# Patient Record
Sex: Female | Born: 1935 | Race: White | Hispanic: No | Smoking: Current every day smoker
Health system: Southern US, Community
[De-identification: ages and names within clinical notes are randomized; demographics above are authoritative.]

## PROBLEM LIST (undated history)

## (undated) DIAGNOSIS — B019 Varicella without complication: Secondary | ICD-10-CM

## (undated) DIAGNOSIS — E079 Disorder of thyroid, unspecified: Secondary | ICD-10-CM

## (undated) DIAGNOSIS — F419 Anxiety disorder, unspecified: Secondary | ICD-10-CM

## (undated) DIAGNOSIS — F17210 Nicotine dependence, cigarettes, uncomplicated: Secondary | ICD-10-CM

## (undated) DIAGNOSIS — I10 Essential (primary) hypertension: Secondary | ICD-10-CM

## (undated) DIAGNOSIS — G709 Myoneural disorder, unspecified: Secondary | ICD-10-CM

## (undated) DIAGNOSIS — I739 Peripheral vascular disease, unspecified: Secondary | ICD-10-CM

## (undated) DIAGNOSIS — Z720 Tobacco use: Secondary | ICD-10-CM

## (undated) DIAGNOSIS — J439 Emphysema, unspecified: Secondary | ICD-10-CM

## (undated) DIAGNOSIS — E785 Hyperlipidemia, unspecified: Principal | ICD-10-CM

## (undated) DIAGNOSIS — J449 Chronic obstructive pulmonary disease, unspecified: Secondary | ICD-10-CM

## (undated) DIAGNOSIS — R030 Elevated blood-pressure reading, without diagnosis of hypertension: Secondary | ICD-10-CM

## (undated) DIAGNOSIS — J189 Pneumonia, unspecified organism: Secondary | ICD-10-CM

## (undated) DIAGNOSIS — R634 Abnormal weight loss: Secondary | ICD-10-CM

## (undated) DIAGNOSIS — G610 Guillain-Barre syndrome: Secondary | ICD-10-CM

## (undated) DIAGNOSIS — K219 Gastro-esophageal reflux disease without esophagitis: Secondary | ICD-10-CM

## (undated) HISTORY — DX: Anxiety disorder, unspecified: F41.9

## (undated) HISTORY — DX: Pneumonia, unspecified organism: J18.9

## (undated) HISTORY — DX: Emphysema, unspecified: J43.9

## (undated) HISTORY — DX: Elevated blood-pressure reading, without diagnosis of hypertension: R03.0

## (undated) HISTORY — DX: Gastro-esophageal reflux disease without esophagitis: K21.9

## (undated) HISTORY — DX: Peripheral vascular disease, unspecified: I73.9

## (undated) HISTORY — DX: Hyperlipidemia, unspecified: E78.5

## (undated) HISTORY — DX: Essential (primary) hypertension: I10

## (undated) HISTORY — DX: Abnormal weight loss: R63.4

## (undated) HISTORY — DX: Disorder of thyroid, unspecified: E07.9

## (undated) HISTORY — DX: Guillain-Barre syndrome: G61.0

## (undated) HISTORY — DX: Varicella without complication: B01.9

## (undated) HISTORY — DX: Myoneural disorder, unspecified: G70.9

## (undated) HISTORY — DX: Nicotine dependence, cigarettes, uncomplicated: F17.210

## (undated) HISTORY — DX: Chronic obstructive pulmonary disease, unspecified: J44.9

## (undated) HISTORY — DX: Tobacco use: Z72.0

## (undated) HISTORY — PX: CATARACT EXTRACTION, BILATERAL: SHX1313

---

## 1998-09-08 ENCOUNTER — Other Ambulatory Visit: Admission: RE | Admit: 1998-09-08 | Discharge: 1998-09-08 | Payer: Self-pay | Admitting: Obstetrics and Gynecology

## 1999-08-21 ENCOUNTER — Other Ambulatory Visit: Admission: RE | Admit: 1999-08-21 | Discharge: 1999-08-21 | Payer: Self-pay | Admitting: Obstetrics and Gynecology

## 2000-08-06 ENCOUNTER — Other Ambulatory Visit: Admission: RE | Admit: 2000-08-06 | Discharge: 2000-08-06 | Payer: Self-pay | Admitting: Emergency Medicine

## 2001-07-24 ENCOUNTER — Other Ambulatory Visit: Admission: RE | Admit: 2001-07-24 | Discharge: 2001-07-24 | Payer: Self-pay | Admitting: Emergency Medicine

## 2001-07-30 ENCOUNTER — Encounter: Admission: RE | Admit: 2001-07-30 | Discharge: 2001-07-30 | Payer: Self-pay | Admitting: Emergency Medicine

## 2001-07-30 ENCOUNTER — Encounter: Payer: Self-pay | Admitting: Emergency Medicine

## 2002-03-23 ENCOUNTER — Encounter: Payer: Self-pay | Admitting: Emergency Medicine

## 2002-03-23 ENCOUNTER — Encounter: Admission: RE | Admit: 2002-03-23 | Discharge: 2002-03-23 | Payer: Self-pay | Admitting: Emergency Medicine

## 2004-07-29 ENCOUNTER — Encounter: Admission: RE | Admit: 2004-07-29 | Discharge: 2004-07-29 | Payer: Self-pay | Admitting: Emergency Medicine

## 2005-02-03 ENCOUNTER — Encounter: Admission: RE | Admit: 2005-02-03 | Discharge: 2005-02-03 | Payer: Self-pay | Admitting: Emergency Medicine

## 2005-10-05 ENCOUNTER — Ambulatory Visit (HOSPITAL_COMMUNITY): Admission: RE | Admit: 2005-10-05 | Discharge: 2005-10-05 | Payer: Self-pay | Admitting: Gastroenterology

## 2006-03-12 ENCOUNTER — Ambulatory Visit (HOSPITAL_COMMUNITY): Admission: RE | Admit: 2006-03-12 | Discharge: 2006-03-12 | Payer: Self-pay | Admitting: Ophthalmology

## 2006-08-03 ENCOUNTER — Emergency Department (HOSPITAL_COMMUNITY): Admission: EM | Admit: 2006-08-03 | Discharge: 2006-08-03 | Payer: Self-pay | Admitting: Emergency Medicine

## 2008-06-08 ENCOUNTER — Encounter: Admission: RE | Admit: 2008-06-08 | Discharge: 2008-06-08 | Payer: Self-pay | Admitting: Emergency Medicine

## 2009-06-24 DIAGNOSIS — J4489 Other specified chronic obstructive pulmonary disease: Secondary | ICD-10-CM | POA: Insufficient documentation

## 2009-06-24 DIAGNOSIS — J449 Chronic obstructive pulmonary disease, unspecified: Secondary | ICD-10-CM

## 2009-06-28 ENCOUNTER — Ambulatory Visit: Payer: Self-pay | Admitting: Emergency Medicine

## 2009-06-28 DIAGNOSIS — G610 Guillain-Barre syndrome: Secondary | ICD-10-CM

## 2009-06-28 DIAGNOSIS — J438 Other emphysema: Secondary | ICD-10-CM | POA: Insufficient documentation

## 2009-08-01 ENCOUNTER — Emergency Department (HOSPITAL_COMMUNITY): Admission: EM | Admit: 2009-08-01 | Discharge: 2009-08-01 | Payer: Self-pay | Admitting: Emergency Medicine

## 2009-08-03 ENCOUNTER — Encounter (HOSPITAL_BASED_OUTPATIENT_CLINIC_OR_DEPARTMENT_OTHER): Admission: RE | Admit: 2009-08-03 | Discharge: 2009-09-22 | Payer: Self-pay | Admitting: General Surgery

## 2009-08-22 ENCOUNTER — Ambulatory Visit: Payer: Self-pay | Admitting: Vascular Surgery

## 2009-08-22 ENCOUNTER — Encounter (HOSPITAL_BASED_OUTPATIENT_CLINIC_OR_DEPARTMENT_OTHER): Payer: Self-pay | Admitting: General Surgery

## 2009-08-22 ENCOUNTER — Ambulatory Visit: Admission: RE | Admit: 2009-08-22 | Discharge: 2009-08-22 | Payer: Self-pay | Admitting: General Surgery

## 2010-05-03 LAB — PULMONARY FUNCTION TEST

## 2010-06-09 IMAGING — CR DG FOOT COMPLETE 3+V*R*
3 series · 3 of 3 positions shown · non-contrast
Comparison: None

CLINICAL DATA: Infection on dorsum of foot

RIGHT FOOT COMPLETE - 3+ VIEW

[t foot ap right]
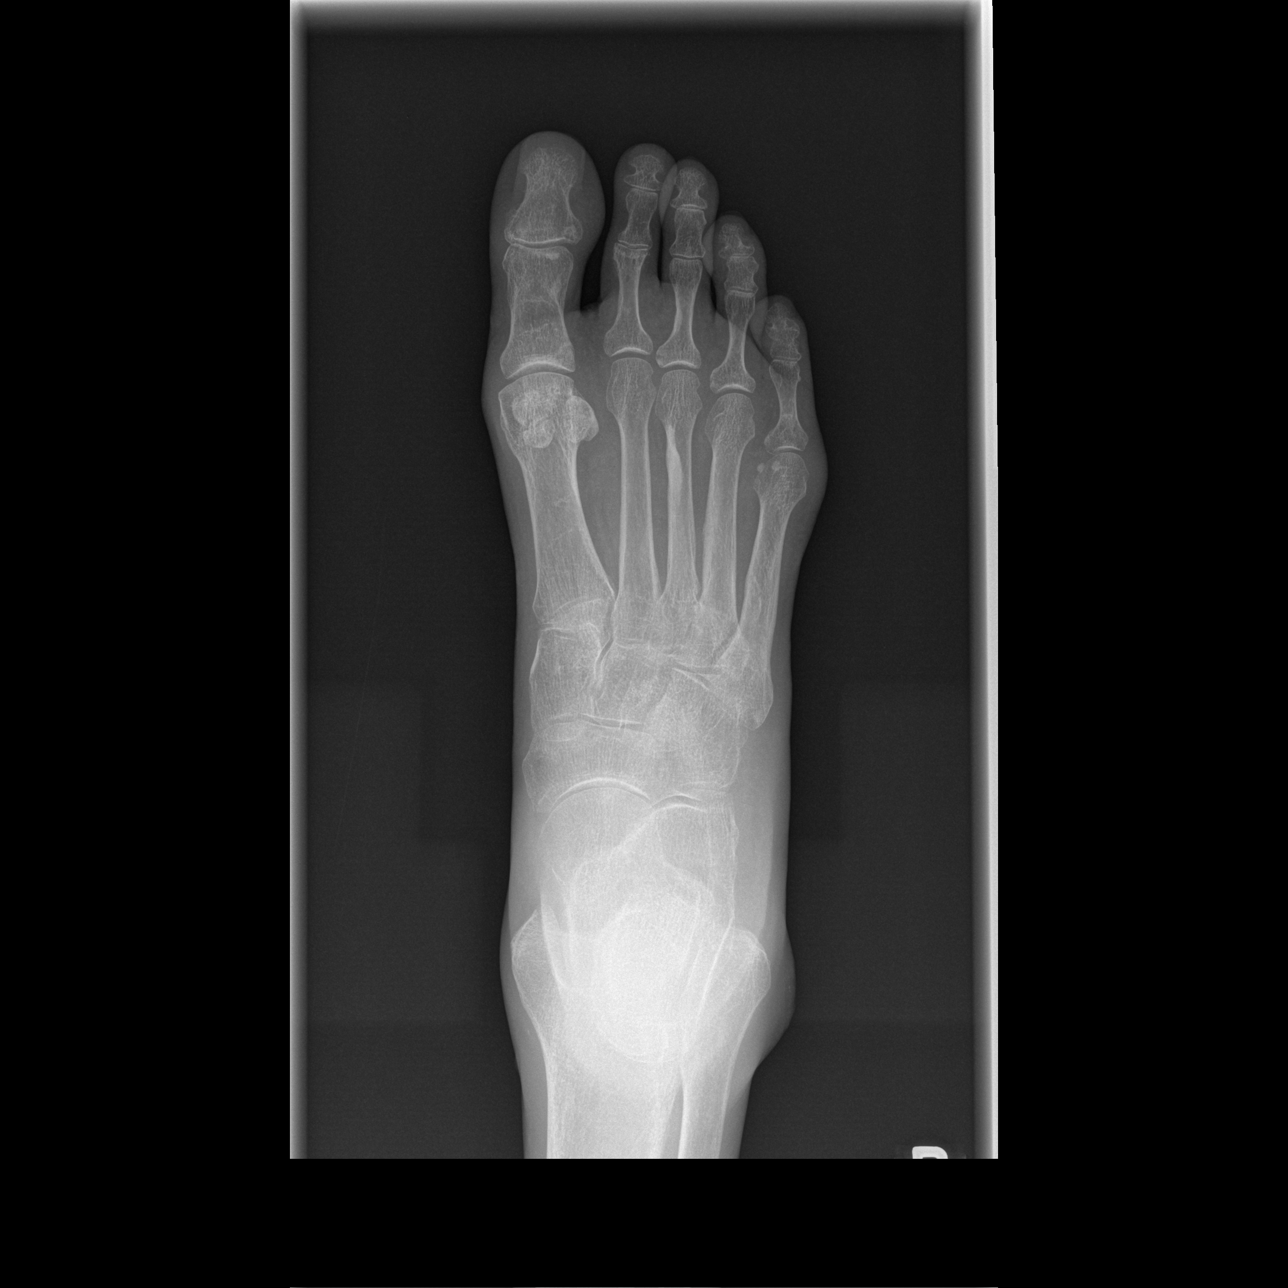

[t foot oblique right]
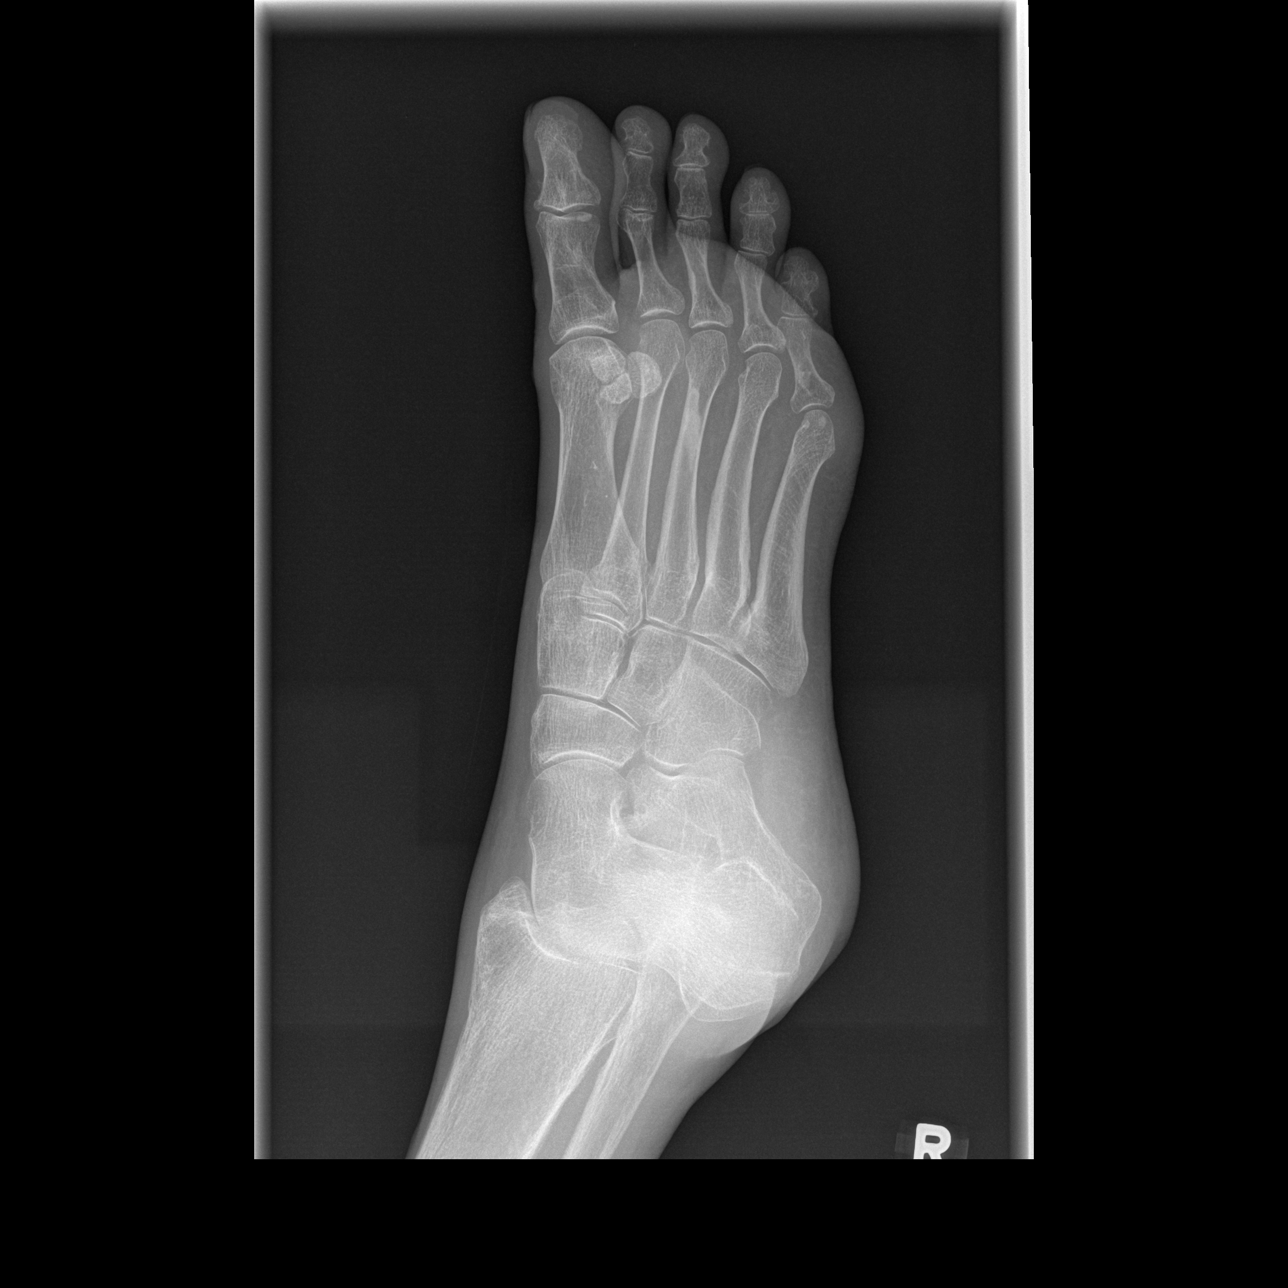

[t foot lat right]
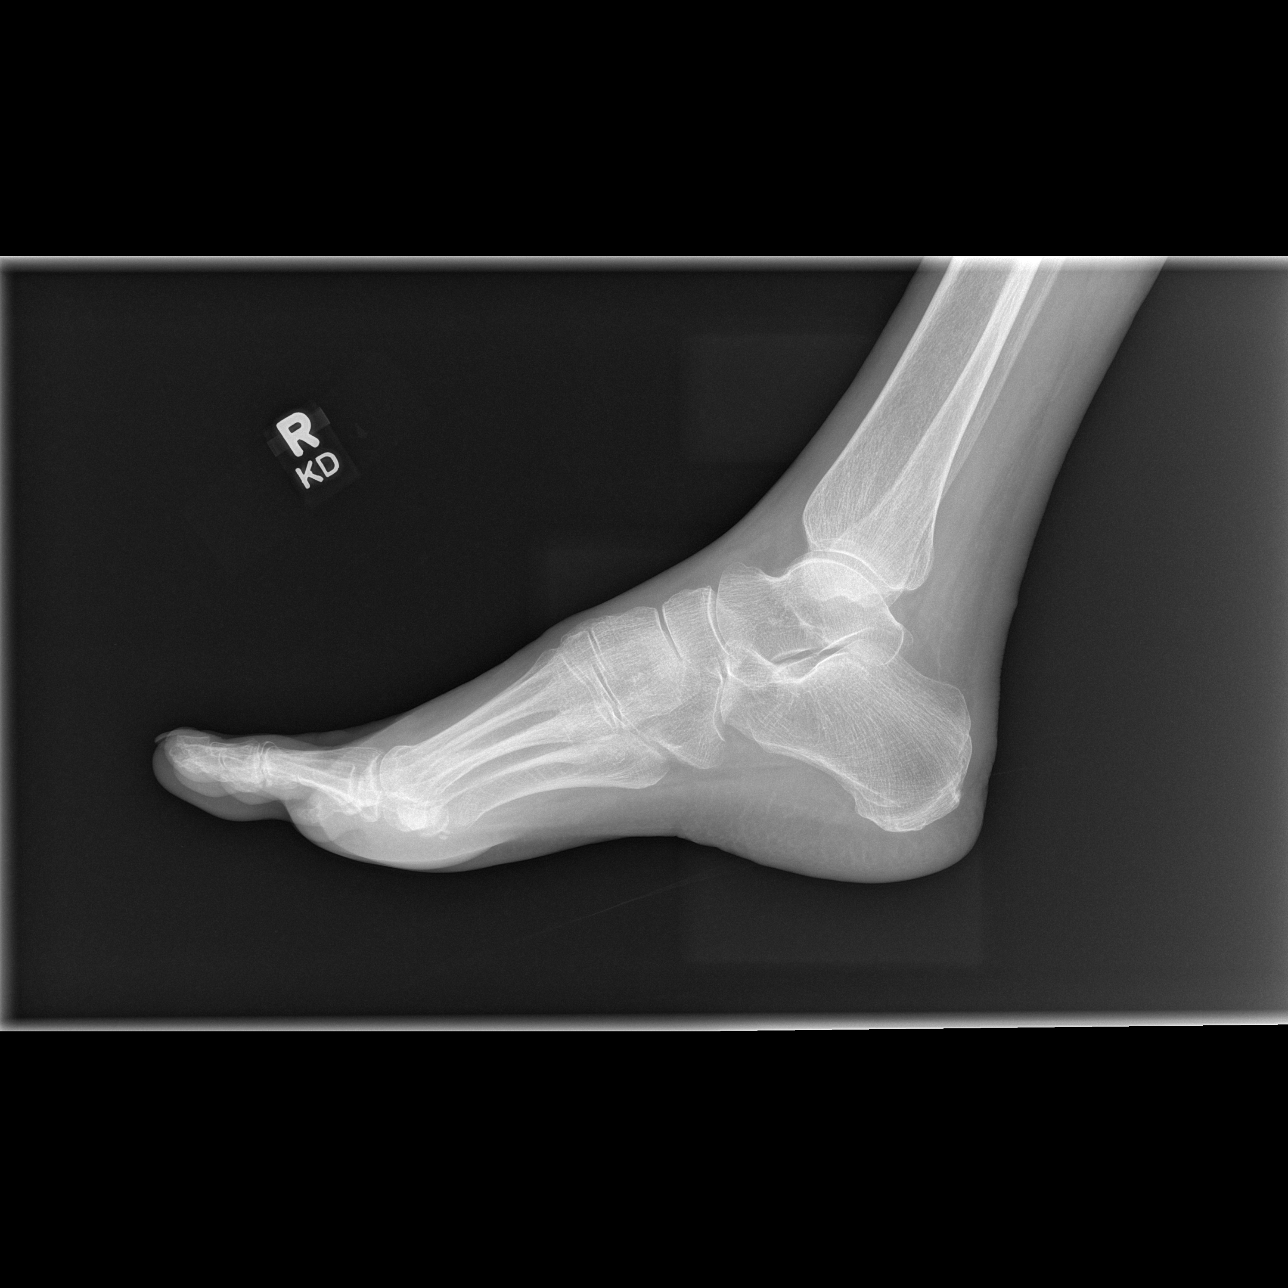

[3 of 3 positions shown; findings below may reference images not displayed]

FINDINGS: The bones are demineralized.  No fractures or lytic
lesions.  There is nonspecific thickening of the cortex of the
distal third metatarsal on the medial side.  This is a benign
finding.  No focal arthropathy.  No foreign body or gas in the soft
tissues.
IMPRESSION: 1.  Osteopenia.
2.  Benign cortical thickening of the third metatarsal.
3.  No acute or specific abnormality.

## 2011-03-31 LAB — HEMOGLOBIN A1C
Hgb A1c MFr Bld: 6.2 % — ABNORMAL HIGH (ref 4.6–6.1)
Mean Plasma Glucose: 131 mg/dL

## 2011-03-31 LAB — PREALBUMIN: Prealbumin: 22.1 mg/dL (ref 18.0–45.0)

## 2011-04-01 LAB — BASIC METABOLIC PANEL
BUN: 9 mg/dL (ref 6–23)
CO2: 30 mEq/L (ref 19–32)
Calcium: 9.3 mg/dL (ref 8.4–10.5)
Chloride: 104 mEq/L (ref 96–112)
Creatinine, Ser: 0.91 mg/dL (ref 0.4–1.2)
GFR calc Af Amer: >60 mL/min (ref >60)
GFR calc non Af Amer: >60 mL/min (ref >60)
Glucose, Bld: 102 mg/dL — ABNORMAL HIGH (ref 70–99)
Potassium: 4.5 mEq/L (ref 3.5–5.1)
Sodium: 140 mEq/L (ref 135–145)

## 2011-04-01 LAB — CBC
HCT: 46.6 % — ABNORMAL HIGH (ref 36.0–46.0)
Hemoglobin: 15.5 g/dL — ABNORMAL HIGH (ref 12.0–15.0)
MCHC: 33.3 g/dL (ref 30.0–36.0)
MCV: 98.3 fL (ref 78.0–100.0)
Platelets: 184 10*3/uL (ref 150–400)
RBC: 4.74 MIL/uL (ref 3.87–5.11)
RDW: 14.3 % (ref 11.5–15.5)
WBC: 8.1 10*3/uL (ref 4.0–10.5)

## 2011-05-08 NOTE — Assessment & Plan Note (Signed)
[facility]   NAME:  Barbara Phelps, Barbara Phelps                ACCOUNT NO.:  [id number]   MEDICAL RECORD NO.:  [id number]      DATE OF BIRTH:  Jan 02, 1932   PHYSICIAN:  Leonie Man, M.D.    VISIT DATE:  08/22/2009                                   OFFICE VISIT   PROBLEM:  Arterial insufficiency ulcers of the right foot in this 77-  year-old female who smokes cigarettes.  The most recent dimensions of  the ulcers are 0.9 x 0.4 x 0.1 and 0.5. x 0.7 x 0.1 which shows some  significant amount of healing since we last saw her.  She underwent  arterial Doppler studies which shows monophasic waveforms from the point  of the common femoral down through the popliteal with severe  atherogeneous plaque to the bifurcation.  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  The feet continued to remain hyperemic and with  dependency there is significant dependent hyperemia.  The wounds are,  however, seeming to close down and today her temperature is 98.5, pulse  78, respirations 19, and blood pressure is 128/74.  We will continue to  treat these wounds with hydrogel, gauze, and Kerlix and that should be  changed every day and we will follow up with her in approximately 1  week.      Leonie Man, M.D.  Electronically Signed     PB/MEDQ  D:  08/22/2009  T:  08/23/2009  Job:  [id number]

## 2011-05-08 NOTE — Assessment & Plan Note (Signed)
[facility]   NAME:  Barbara Phelps, Barbara Phelps                ACCOUNT NO.:  [id number]   MEDICAL RECORD NO.:  [id number]      DATE OF BIRTH:  March 23, 1932   PHYSICIAN:  Leonie Man, M.D.    VISIT DATE:  08/15/2009                                   OFFICE VISIT   PROBLEM:  Arterial insufficiency, ulcers of the right foot in this 77-  year-old patient who continues to smoke despite multiple warnings.  These ulcers have been measured at for the right anterior foot 0.7 x 0.9  x 0.1 and for the right lower extremity anteriorly at 1.0 x 1.5 x 0.1.  The patient has complaints of significant right leg pain which is only  relieved by dependency.   On examination today:  Temperature 98, pulse 74, respirations 16, blood  pressure 151/69.  The patient has significant dependent hyperemia and  the ulcers are shallow.  The ulcer bases are clean and not requiring any  debridement on today's evaluation.  We will go ahead and treat her for  the right foot medially with Prisma hydrogel and for the right foot  laterally with hydrogel only.  The dressings are to be changed every 3  days.  I am going to go ahead and send her for duplex arterial Dopplers  of bilaterally in the legs.  We will also check her hemoglobin A1c and  prealbumin.  I have asked her to come back and see [place] in 1 week.      Leonie Man, M.D.  Electronically Signed     PB/MEDQ  D:  08/15/2009  T:  08/16/2009  Job:  [id number]

## 2011-05-08 NOTE — Assessment & Plan Note (Signed)
[facility]   NAME:  Barbara Phelps, Barbara Phelps NO.:  [id number]   MEDICAL RECORD NO.:  [id number]      DATE OF BIRTH:  1932-02-17   PHYSICIAN:  Ardath Sax, M.D.           VISIT DATE:                                   OFFICE VISIT   She is a 77 year old lady who has had problems with weakness of her  lower extremities since she had a Guillain-Barre disease sometime ago.  She also has glaucoma, emphysema, and hypertension.  She also takes  Synthroid.  She states that her right anterior foot is swollen and sore  and she certainly has some cellulitis on the anterior aspect and has  probably come from a small abrasion when she fell and the little ulcers  are only about a 0.5 cm in diameter and I put her on Keflex 500 mg 3  times a day because the cellulitis was symptomatic and very erythematous  and she had been running a low-grade fever.  I took a culture from one  of the ulcers.  I had her come back in a week.      Ardath Sax, M.D.     PP/MEDQ  D:  08/03/2009  T:  08/03/2009  Job:  [id number]

## 2011-05-11 NOTE — Op Note (Signed)
Barbara Phelps, BOIES                ACCOUNT NO.:  [id number]   MEDICAL RECORD NO.:  [id number]          PATIENT TYPE:  AMB   LOCATION:  ENDO                         FACILITY:  [facility]   PHYSICIAN:  Bernette Redbird, M.D.   DATE OF BIRTH:  [birth year]   DATE OF PROCEDURE:  10/05/2005  DATE OF DISCHARGE:                                 OPERATIVE REPORT   PROCEDURE:  Colonoscopy.   INDICATIONS:  Screening colonoscopy in a 77 year old female with a family  history of colon polyps (indeterminate histology).   FINDINGS:  Extensive sigmoid diverticulosis, otherwise normal to cecum.   DESCRIPTION OF PROCEDURE:  The nature, purpose, and risks of the procedure  had been reviewed with the patient by me prior to the procedure, having also  discussed it with our staff at the office when she was set up as an open  access patient. The patient provided written consent. She was sedated with  fentanyl 75 mcg and Versed 6 mg IV prior to and during the course of the  procedure with careful attention to her oxygen status and she does have a  history of COPD but she never had any problems with desaturation or  respiratory difficulty despite a level mild to moderate sedation.   The Olympus pediatric adjustable tension video colonoscope was advanced with  moderate difficulty through a very angulated sigmoid region, with passage  made easier by having the patient in the supine position. Once past the  sigmoid region, the scope was able be advanced quite easily to the cecum as  identified by visualization of the appendiceal orifice and the absence of  further lumen and pullback was then performed. The quality of the prep was  excellent and it is felt that all areas were well seen.   There was moderately severe sigmoid diverticulosis as mentioned above, but  this was otherwise a normal examination, without evidence of polyps, cancer,  colitis or vascular malformations. Due to a small rectal ampulla, I did not  perform retroflexion in the rectum but antegrade viewing and reinspection of  the rectum was unremarkable. No biopsies were obtained. The patient  tolerated the procedure well and there were no apparent complications.   IMPRESSION:  Family history of colon polyps, without worrisome findings on  current examination (V18.51).   PLAN:  Since the patient has attained age 26 without having formed any  colonic adenomas, and in view of the fact that she has COPD, I would be  inclined not to perform further routine screening exams in this particular  patient. Depending on the histology of her brother's colon polyps, she would  either be due for a repeat exam at age 60 or at  age 8, and in either event, I think that the risk of the procedure would  likely outweigh benefit. The patient might be considered for virtual  colonoscopy for screening purposes if it is approved by insurance reasons 5  years from now, at the discretion of her primary physician and depending on  her overall health status.           ______________________________  Bernette Redbird, M.D.     RB/MEDQ  D:  10/05/2005  T:  10/05/2005  Job:  [id number]   cc:   Reuben Likes, M.D.  Fax: [phone]

## 2011-05-11 NOTE — Op Note (Signed)
NAMECHRISTEN, Barbara Phelps                ACCOUNT NO.:  [id number]   MEDICAL RECORD NO.:  [id number]          PATIENT TYPE:  AMB   LOCATION:  SDS                          FACILITY:  [facility]   PHYSICIAN:  Alford Highland. Rankin, M.D.   DATE OF BIRTH:  [birth year]   DATE OF PROCEDURE:  03/12/2006  DATE OF DISCHARGE:  03/12/2006                                 OPERATIVE REPORT   PREOPERATIVE DIAGNOSIS:  Retained lens fragments, left eye, nonmetallic  foreign body  (mostly small fragments-some perinuclear).   POSTOPERATIVE DIAGNOSES:  1.  Retained lens fragments, left eye, nonmetallic foreign body  (mostly      small fragments-some perinuclear).  2.  Mild uveitis.   OPERATION PERFORMED:  1.  Posterior vitrectomy, left eye, 20 gauge.   SURGEON:  Alford Highland. Rankin, M.D.   ANESTHESIA:  Local retrobulbar with monitored anesthesia control.   INDICATIONS FOR PROCEDURE:  The patient is a 77 year old woman who has  significant vitreous debris, uveitis and large fragments that were not  safely dissolved.  These were in the vitreous cavity after complicated  cataract surgery a week ago.  The patient understands the risks of  anesthesia including the rare occurrence of death but also to the eye  including but not limited to hemorrhage, infection, scarring, need for  another surgery, no change in vision, loss of vision and progression of  disease despite intervention.  After appropriate signed consent was  obtained, the patient was taken to the operating room.   DESCRIPTION OF PROCEDURE:  In the operating room, appropriate monitoring was  followed by mild sedation.  0.75% Marcaine was delivered 5 mL retrobulbar  followed by an additional 5 mL laterally in the fashion of modified Darel Hong.  The right periocular region was sterilely prepped and draped in the  usual ophthalmic fashion.  A lid speculum was applied.  Conjunctival  peritomy was fashioned superiorly and temporally.  The infusion was secured  3.5 mm  posterior to the limbus.  Placement in the vitreous cavity verified  visually.  Superior sclerotomies then fashioned.  Wild microscope was placed  in position with the Biom attachment.  Core vitrectomy was then begun.  Numerous over 40 fragments were found in the vitreous cavity and these were  removed with vitrectomy instrumentation.  Large perinuclear fragment was  also removed in this fashion.  Some small cortical fragments could be found  in the capsular bag and these could be approached from posterior approach  and were aspirated free.  Others appeared to be somewhat sticky and could  not safely be removed without endangering the peripheral capsule.  At this  time the anterior chamber was also flushed free, therapeutic paracentesis  with the paracentesis incision.  At this time instruments were removed from  the eye and superior sclerotomies were then closed with 7-0 Vicryl.  The  infusion removed and similarly closed with 7-0 Vicryl.  Peripheral retina  was inspected and found to be free of  retinal holes or tears.  Conjunctiva closed with 7-0 Vicryl.  Subconjunctival injection of antibiotic applied.  A sterile  patch and Fox  shield were applied.  . The patient was taken to the short stay area in good  and stable condition without complication.      Alford Highland Rankin, M.D.  Electronically Signed     GAR/MEDQ  D:  03/12/2006  T:  03/13/2006  Job:  [id number]   cc:   Allen Norris, M.D.  Fax: [phone]

## 2011-07-04 ENCOUNTER — Ambulatory Visit (INDEPENDENT_AMBULATORY_CARE_PROVIDER_SITE_OTHER): Payer: Medicare Other | Admitting: Family Medicine

## 2011-07-04 ENCOUNTER — Encounter: Payer: Self-pay | Admitting: Family Medicine

## 2011-07-04 VITALS — BP 154/75 | HR 86 | Temp 97.7°F | Ht 68.0 in | Wt 102.8 lb

## 2011-07-04 DIAGNOSIS — F419 Anxiety disorder, unspecified: Secondary | ICD-10-CM | POA: Insufficient documentation

## 2011-07-04 DIAGNOSIS — J439 Emphysema, unspecified: Secondary | ICD-10-CM | POA: Insufficient documentation

## 2011-07-04 DIAGNOSIS — L570 Actinic keratosis: Secondary | ICD-10-CM

## 2011-07-04 DIAGNOSIS — K219 Gastro-esophageal reflux disease without esophagitis: Secondary | ICD-10-CM

## 2011-07-04 DIAGNOSIS — E079 Disorder of thyroid, unspecified: Secondary | ICD-10-CM | POA: Insufficient documentation

## 2011-07-04 DIAGNOSIS — F17200 Nicotine dependence, unspecified, uncomplicated: Secondary | ICD-10-CM

## 2011-07-04 DIAGNOSIS — IMO0001 Reserved for inherently not codable concepts without codable children: Secondary | ICD-10-CM

## 2011-07-04 DIAGNOSIS — G4700 Insomnia, unspecified: Secondary | ICD-10-CM

## 2011-07-04 DIAGNOSIS — G610 Guillain-Barre syndrome: Secondary | ICD-10-CM

## 2011-07-04 DIAGNOSIS — H269 Unspecified cataract: Secondary | ICD-10-CM | POA: Insufficient documentation

## 2011-07-04 DIAGNOSIS — I739 Peripheral vascular disease, unspecified: Secondary | ICD-10-CM | POA: Insufficient documentation

## 2011-07-04 DIAGNOSIS — E119 Type 2 diabetes mellitus without complications: Secondary | ICD-10-CM

## 2011-07-04 DIAGNOSIS — Z720 Tobacco use: Secondary | ICD-10-CM

## 2011-07-04 DIAGNOSIS — J449 Chronic obstructive pulmonary disease, unspecified: Secondary | ICD-10-CM | POA: Insufficient documentation

## 2011-07-04 HISTORY — DX: Reserved for inherently not codable concepts without codable children: IMO0001

## 2011-07-04 MED ORDER — RANITIDINE HCL 300 MG PO TABS: 300.0000 mg | ORAL_TABLET | Freq: Every day | ORAL | Status: DC

## 2011-07-04 MED ORDER — ALPRAZOLAM 0.5 MG PO TABS: 0.5000 mg | ORAL_TABLET | Freq: Every evening | ORAL | Status: DC | PRN

## 2011-07-04 NOTE — Patient Instructions (Signed)
Nicotine Addiction Nicotine can act as both a stimulant (excites/activates) and a sedative (calms/quiets). Immediately after exposure to nicotine, there is a "kick" caused in part by the drug's stimulation of the adrenal glands and resulting discharge of adrenaline (epinephrine). The rush of adrenaline stimulates the body and causes a sudden release of sugar. This means that smokers are always slightly hyperglycemic. Hyperglycemic means that the blood sugar is high, just like in diabetics. Nicotine also decreases the amount of insulin which helps control sugar levels in the body. There is an increase in blood pressure, breathing, and the rate of heart beats.  In addition, nicotine indirectly causes a release of dopamine in the brain that controls pleasure and motivation. A similar reaction is seen with other drugs of abuse, such as cocaine and heroin. This dopamine release is thought to cause the pleasurable sensations when smoking. In some different cases, nicotine can also create a calming effect, depending on sensitivity of the smoker's nervous system and the dose of nicotine taken. WHAT HAPPENS WHEN NICOTINE IS TAKEN FOR LONG PERIODS OF TIME?  Long-term use of nicotine results in addiction. It is difficult to stop.   Repeated use of nicotine creates tolerance. Higher doses of nicotine are needed to get the "kick."  When nicotine use is stopped, withdrawal may last a month or more. Withdrawal may begin within a few hours after the last cigarette. Symptoms peak within the first few days and may lessen within a few weeks. For some people, however, symptoms may last for months or longer. Withdrawal symptoms include:   Irritability.   Craving.   Learning and attention deficits.   Sleep disturbances.   Increased appetite.  Craving for tobacco may last for 6 months or longer. Many behaviors done while using nicotine can also play a part in the severity of withdrawal symptoms. For some people, the  feel, smell, and sight of a cigarette and the ritual of obtaining, handling, lighting, and smoking the cigarette are closely linked with the pleasure of smoking. When stopped, they also miss the related behaviors which make the withdrawal or craving worse. While nicotine gum and patches may lessen the drug aspects of withdrawal, cravings often persist. WHAT ARE THE MEDICAL CONSEQUENCES OF NICOTINE USE?  Nicotine addiction accounts for one-third of all cancers. The top cancer caused by tobacco is lung cancer. Lung cancer is the number one cancer killer of both men and women.   Smoking is also associated with cancers of the:   Mouth.  Pharynx.   Larynx.   Esophagus.  Stomach.   Pancreas.   Cervix.  Kidney.   Ureter.   Bladder.    Smoking also causes lung diseases such as lasting (chronic) bronchitis and emphysema.   It worsens asthma in adults and children.   Smoking increases the risk of heart disease, including:   Stroke.  Heart attack.  Vascular disease.  Aneurysm.   Passive or secondary smoke can also increase medical risks including:   Asthma in children.   Sudden Infant Death Syndrome (SIDS).   Additionally, dropped cigarettes are the leading cause of residential fire fatalities.   Nicotine poisoning has been reported from accidental ingestion of tobacco products by children and pets. Death usually results in a few minutes from respiratory failure (when a person stops breathing) caused by paralysis.  TREATMENT FOR NICOTINE ADDICTION  Medication. Nicotine replacement medicines such as nicotine gum and the patch are used to stop smoking. These medicines gradually lower the dosage of nicotine in  the body. These medicines do not contain the carbon monoxide and other toxins found in tobacco smoke.   Hypnotherapy.   Relaxation therapy.   Nicotine Anonymous (a 12-step support program). Find times and locations in your local yellow pages.  Document Released:  08/15/2004 Document Re-Released: 01/01/2010 [facility] Patient Information 2011 [place], [place].

## 2011-07-06 ENCOUNTER — Encounter: Payer: Self-pay | Admitting: Family Medicine

## 2011-07-06 DIAGNOSIS — E119 Type 2 diabetes mellitus without complications: Secondary | ICD-10-CM | POA: Insufficient documentation

## 2011-07-06 NOTE — Assessment & Plan Note (Signed)
Patient reports that she has been told she was borderline diabetic in the past. We will check a hgba1c and request old records

## 2011-07-06 NOTE — Assessment & Plan Note (Signed)
Tolerating current dose of Levothyroxine. No change in dosing until labs are checked

## 2011-07-06 NOTE — Assessment & Plan Note (Signed)
Encouraged complete cessation and counseled for greater than 3 minutes regarding the need to quit. She expresses understanding but is not prepared to quit at this time.

## 2011-07-06 NOTE — Progress Notes (Signed)
Barbara Phelps [id number] 08/14/1932 07/06/2011      Progress Note New Patient  Subjective  Chief Complaint  Chief Complaint  Patient presents with  . Establish Care    new patient    HPI  Patient is a 77 year old Caucasian female in today for new patient appointment. His complex medical history which includes a distant history of DM. In 1995 resulting in to have multiple hospitalization. This left her with persistent left arm and right leg weakness. She does note as she ages this becomes notably worse. She is a long-time smoker and unfortunately continues to smoke. She gargles with a daily cough up, congestion and postnasal drip. She struggles with chronic pain. She's had MRIs in the past she reports show significant arthritis degenerative disease and some stenosis suggestive of a pinched nerves most notably in her lower back. She has a history of osteoporosis continues to smoke a half pack a day she is down from 2 packs per day. No fevers, chills, chest pain, palpitations, shortness of breath that is unusual at this time. She reports being told the past with borderline diabetic but does not check her sugars. Denies having trouble with blood pressure in the past. Denies headache worsening congestion or sore throat at this time no GI or GU complaints.  Past Medical History  Diagnosis Date  . Cigarette smoker   . Chicken pox as a child  . Measles as a child  . Mumps as a child  . Anxiety     panic attacks  . Cataract   . COPD (chronic obstructive pulmonary disease)   . Emphysema of lung   . Glaucoma   . Thyroid disease   . Peripheral vascular disease   . Osteoporosis   . Neuromuscular disorder   . Guillain-Barre syndrome     1995, 2 1/2 months in hospital  . Tobacco abuse disorder   . Pneumonia     June 2012, left side  . Reflux 07/04/2011  . Diabetes mellitus 07/06/2011    Past Surgical History  Procedure Date  . Cataract extraction, bilateral     Family History  Problem  Relation Age of Onset  . Cancer Mother     lung/ nonsmoker  . Heart attack Father   . Hypertension Father   . Hypertension Sister   . Hyperlipidemia Sister   . Arthritis Sister   . Hypertension Brother   . COPD Brother     smoker  . Emphysema Brother   . Diabetes Paternal Grandfather   . Diabetes Sister     type 2  . Heart disease Sister   . Hypertension Sister   . Hyperlipidemia Sister   . Lupus Sister   . Kidney disease Sister   . Cancer Brother 39    brain   . Cancer Brother 98    melonoma  . Hypertension Brother   . Heart disease Brother   . Cancer Brother 27    lung/ smoker  . Cancer Brother 85    pancreatic/ smoker  . Other Daughter     spinal bifida  . Fibromyalgia Daughter   . Cancer Son 9    liver    History   Social History  . Marital Status: Widowed    Spouse Name: N/A    Number of Children: N/A  . Years of Education: N/A   Occupational History  . Not on file.   Social History Main Topics  . Smoking status: Current Everyday Smoker -- 0.5 packs/day  for 60 years    Types: Cigarettes  . Smokeless tobacco: Never Used  . Alcohol Use: No  . Drug Use: No  . Sexually Active: No   Other Topics Concern  . Not on file   Social History Narrative  . No narrative on file    No current outpatient prescriptions on file prior to visit.    No Known Allergies  Review of Systems  Review of Systems  Constitutional: Negative for fever, chills and malaise/fatigue.  HENT: Positive for congestion. Negative for hearing loss and nosebleeds.   Eyes: Negative for discharge.  Respiratory: Positive for cough and sputum production. Negative for shortness of breath and wheezing.   Cardiovascular: Negative for chest pain, palpitations and leg swelling.  Gastrointestinal: Negative for heartburn, nausea, vomiting, abdominal pain, diarrhea, constipation and blood in stool.  Genitourinary: Negative for dysuria, urgency, frequency and hematuria.  Musculoskeletal:  Negative for myalgias, back pain and falls.  Skin: Negative for rash.  Neurological: Negative for dizziness, tremors, sensory change, focal weakness, loss of consciousness, weakness and headaches.  Endo/Heme/Allergies: Negative for polydipsia. Does not bruise/bleed easily.  Psychiatric/Behavioral: Negative for depression and suicidal ideas. The patient is not nervous/anxious and does not have insomnia.     Objective  BP 154/75  Pulse 86  Temp(Src) 97.7 F (36.5 C) (Oral)  Ht 5\' 8"  (1.727 m)  Wt 102 lb 12.8 oz (46.63 kg)  BMI 15.63 kg/m2  SpO2 96%  Physical Exam  Physical Exam  Constitutional: She is oriented to person, place, and time and well-developed, well-nourished, and in no distress. No distress.       Pale, frail  HENT:  Head: Normocephalic and atraumatic.  Right Ear: External ear normal.  Left Ear: External ear normal.  Nose: Nose normal.  Mouth/Throat: Oropharynx is clear and moist. No oropharyngeal exudate.  Eyes: Conjunctivae are normal. Pupils are equal, round, and reactive to light. Right eye exhibits no discharge. Left eye exhibits no discharge. No scleral icterus.  Neck: Normal range of motion. Neck supple. No thyromegaly present.  Cardiovascular: Normal rate, regular rhythm, normal heart sounds and intact distal pulses.   No murmur heard. Pulmonary/Chest: Effort normal and breath sounds normal. No respiratory distress. She has no wheezes. She has no rales.  Abdominal: Soft. Bowel sounds are normal. She exhibits no distension and no mass. There is no tenderness.  Musculoskeletal: Normal range of motion. She exhibits no edema and no tenderness.  Lymphadenopathy:    She has no cervical adenopathy.  Neurological: She is alert and oriented to person, place, and time. She has normal reflexes. No cranial nerve deficit. Coordination normal.  Skin: Skin is warm and dry. No rash noted. She is not diaphoretic.  Psychiatric: Mood, memory and affect normal.        Assessment & Plan  Guillain-Barre syndrome Patient has never fully recovered from this episode and notes that she is feeling like her weakness gets worse as she gets older. She was followed by Dr Moises Blood in the hospital at the time of the acute episode  Diabetes mellitus Patient reports that she has been told she was borderline diabetic in the past. We will check a hgba1c and request old records  COPD (chronic obstructive pulmonary disease) Patient notes trouble with an irritated cough and PND, she acknowledges this is at least affected by her cigarette smoking.   Tobacco abuse disorder Encouraged complete cessation and counseled for greater than 3 minutes regarding the need to quit. She expresses understanding but is  not prepared to quit at this time.  Osteoporosis Will need a vitamin d level checked and calcium with vitamin d supplement bid.   Reflux Patient given an rx for Ranitidine 300mg  to use prn. Avoid offending foods  Thyroid disease Tolerating current dose of Levothyroxine. No change in dosing until labs are checked

## 2011-07-06 NOTE — Assessment & Plan Note (Signed)
Patient notes trouble with an irritated cough and PND, she acknowledges this is at least affected by her cigarette smoking.

## 2011-07-06 NOTE — Assessment & Plan Note (Signed)
Patient given an rx for Ranitidine 300mg  to use prn. Avoid offending foods

## 2011-07-06 NOTE — Assessment & Plan Note (Signed)
Patient has never fully recovered from this episode and notes that she is feeling like her weakness gets worse as she gets older. She was followed by Dr Moises Blood in the hospital at the time of the acute episode

## 2011-07-06 NOTE — Assessment & Plan Note (Signed)
Will need a vitamin d level checked and calcium with vitamin d supplement bid.

## 2011-07-12 ENCOUNTER — Encounter: Payer: Self-pay | Admitting: Family Medicine

## 2011-07-19 ENCOUNTER — Ambulatory Visit (INDEPENDENT_AMBULATORY_CARE_PROVIDER_SITE_OTHER): Payer: Medicare Other | Admitting: Family Medicine

## 2011-07-19 ENCOUNTER — Encounter: Payer: Self-pay | Admitting: Family Medicine

## 2011-07-19 DIAGNOSIS — E785 Hyperlipidemia, unspecified: Secondary | ICD-10-CM

## 2011-07-19 DIAGNOSIS — J4489 Other specified chronic obstructive pulmonary disease: Secondary | ICD-10-CM

## 2011-07-19 DIAGNOSIS — E039 Hypothyroidism, unspecified: Secondary | ICD-10-CM

## 2011-07-19 DIAGNOSIS — J449 Chronic obstructive pulmonary disease, unspecified: Secondary | ICD-10-CM

## 2011-07-19 DIAGNOSIS — F17200 Nicotine dependence, unspecified, uncomplicated: Secondary | ICD-10-CM

## 2011-07-19 DIAGNOSIS — E079 Disorder of thyroid, unspecified: Secondary | ICD-10-CM

## 2011-07-19 DIAGNOSIS — J441 Chronic obstructive pulmonary disease with (acute) exacerbation: Secondary | ICD-10-CM

## 2011-07-19 DIAGNOSIS — E119 Type 2 diabetes mellitus without complications: Secondary | ICD-10-CM

## 2011-07-19 DIAGNOSIS — K219 Gastro-esophageal reflux disease without esophagitis: Secondary | ICD-10-CM

## 2011-07-19 DIAGNOSIS — I739 Peripheral vascular disease, unspecified: Secondary | ICD-10-CM

## 2011-07-19 DIAGNOSIS — Z720 Tobacco use: Secondary | ICD-10-CM

## 2011-07-19 HISTORY — DX: Hyperlipidemia, unspecified: E78.5

## 2011-07-19 LAB — LIPID PANEL
HDL: 70.9 mg/dL (ref >39.00)
Total CHOL/HDL Ratio: 3
VLDL: 19.4 mg/dL (ref 0.0–40.0)

## 2011-07-19 LAB — RENAL FUNCTION PANEL
Albumin: 4.7 g/dL (ref 3.5–5.2)
BUN: 10 mg/dL (ref 6–23)
CO2: 27 mEq/L (ref 19–32)
Chloride: 98 mEq/L (ref 96–112)
Creatinine, Ser: 0.8 mg/dL (ref 0.4–1.2)

## 2011-07-19 LAB — MICROALBUMIN / CREATININE URINE RATIO
Creatinine,U: 23.7 mg/dL
Microalb Creat Ratio: 1.3 mg/g (ref 0.0–30.0)
Microalb, Ur: 0.3 mg/dL (ref 0.0–1.9)

## 2011-07-19 LAB — CBC WITH DIFFERENTIAL/PLATELET
Basophils Absolute: 0 10*3/uL (ref 0.0–0.1)
Eosinophils Relative: 1.2 % (ref 0.0–5.0)
HCT: 48.9 % — ABNORMAL HIGH (ref 36.0–46.0)
Hemoglobin: 16.2 g/dL — ABNORMAL HIGH (ref 12.0–15.0)
Lymphocytes Relative: 17.9 % (ref 12.0–46.0)
Lymphs Abs: 1.6 10*3/uL (ref 0.7–4.0)
Monocytes Relative: 7.2 % (ref 3.0–12.0)
Neutro Abs: 6.7 10*3/uL (ref 1.4–7.7)
Platelets: 191 10*3/uL (ref 150.0–400.0)
RDW: 13.2 % (ref 11.5–14.6)
WBC: 9.2 10*3/uL (ref 4.5–10.5)

## 2011-07-19 LAB — HEPATIC FUNCTION PANEL
ALT: 13 U/L (ref 0–35)
AST: 18 U/L (ref 0–37)
Alkaline Phosphatase: 93 U/L (ref 39–117)
Total Bilirubin: 0.7 mg/dL (ref 0.3–1.2)

## 2011-07-19 LAB — HEMOGLOBIN A1C: Hgb A1c MFr Bld: 6.2 % (ref 4.6–6.5)

## 2011-07-19 MED ORDER — NICOTINE 10 MG IN INHA: 1.0000 | RESPIRATORY_TRACT | Status: AC | PRN

## 2011-07-19 NOTE — Patient Instructions (Signed)
Smoking Cessation This document explains the best ways for you to quit smoking and new treatments to help. It lists new medicines that can double or triple your chances of quitting and quitting for good. It also considers ways to avoid relapses and concerns you may have about quitting, including weight gain. NICOTINE: A POWERFUL ADDICTION If you have tried to quit smoking, you know how hard it can be. It is hard because nicotine is a very addictive drug. For some people, it can be as addictive as heroin or cocaine. Usually, people make 2 or 3 tries, or more, before finally being able to quit. Each time you try to quit, you can learn about what helps and what hurts. Quitting takes hard work and a lot of effort, but you can quit smoking. QUITTING SMOKING IS ONE OF THE MOST IMPORTANT THINGS YOU WILL EVER DO:  You will live longer, feel better, and live better.   The impact on your body of quitting smoking is felt almost immediately:   Within 20 minutes, blood pressure decreases. Pulse returns to its normal level.   After 8 hours, carbon monoxide levels in the blood return to normal. Oxygen level increases.   After 24 hours, chance of heart attack starts to decrease. Breath, hair, and body stop smelling like smoke.   After 48 hours, damaged nerve endings begin to recover. Sense of taste and smell improve.   After 72 hours, the body is virtually free of nicotine. Bronchial tubes relax and breathing becomes easier.   After 2 to 12 weeks, lungs can hold more air. Exercise becomes easier and circulation improves.   Quitting will lower your chance of having a heart attack, stroke, cancer, or lung disease:   After 1 year, the risk of coronary heart disease is cut in half.   After 5 years, the risk of stroke falls to the same as a nonsmoker.   After 10 years, the risk of lung cancer is cut in half and the risk of other cancers decreases significantly.   After 15 years, the risk of coronary heart  disease drops, usually to the level of a nonsmoker.   If you are pregnant, quitting smoking will improve your chances of having a healthy baby.   The people you live with, especially your children, will be healthier.   You will have extra money to spend on things other than cigarettes.  FIVE KEYS TO QUITTING Studies have shown that these 5 steps will help you quit smoking and quit for good. You have the best chances of quitting if you use them together: 1. Get ready.  2. Get support and encouragement.  3. Learn new skills and behaviors.  4. Get medicine to reduce your nicotine addiction and use it correctly.  5. Be prepared for relapse or difficult situations. Be determined to continue trying to quit, even if you do not succeed at first.  1. GET READY  Set a quit date.   Change your environment.   Get rid of ALL cigarettes, ashtrays, matches, and lighters in your home, car, and place of work.   Do not let people smoke in your home.   Review your past attempts to quit. Think about what worked and what did not.   Once you quit, do not smoke. NOT EVEN A PUFF!  2. GET SUPPORT AND ENCOURAGEMENT Studies have shown that you have a better chance of being successful if you have help. You can get support in many ways.  Tell  your family, friends, and coworkers that you are going to quit and need their support. Ask them not to smoke around you.   Talk to your caregivers (doctor, dentist, nurse, pharmacist, psychologist, and/or smoking counselor).   Get individual, group, or telephone counseling and support. The more counseling you have, the better your chances are of quitting. Programs are available at local hospitals and health centers. Call your local health department for information about programs in your area.   Spiritual beliefs and practices may help some smokers quit.   Quit meters are small computer programs online or downloadable that keep track of quit statistics, such as amount  of "quit-time," cigarettes not smoked, and money saved.   Many smokers find one or more of the many self-help books available useful in helping them quit and stay off tobacco.  3. LEARN NEW SKILLS AND BEHAVIORS  Try to distract yourself from urges to smoke. Talk to someone, go for a walk, or occupy your time with a task.   When you first try to quit, change your routine. Take a different route to work. Drink tea instead of coffee. Eat breakfast in a different place.   Do something to reduce your stress. Take a hot bath, exercise, or read a book.   Plan something enjoyable to do every day. Reward yourself for not smoking.   Explore interactive web-based programs that specialize in helping you quit.  4. GET MEDICINE AND USE IT CORRECTLY Medicines can help you stop smoking and decrease the urge to smoke. Combining medicine with the above behavioral methods and support can quadruple your chances of successfully quitting smoking. The [place] Food and Drug Administration (FDA) has approved 7 medicines to help you quit smoking. These medicines fall into 3 categories.  Nicotine replacement therapy (delivers nicotine to your body without the negative effects and risks of smoking):   Nicotine gum: Available over-the-counter.   Nicotine lozenges: Available over-the-counter.   Nicotine inhaler: Available by prescription.   Nicotine nasal spray: Available by prescription.   Nicotine skin patches (transdermal): Available by prescription and over-the-counter.   Antidepressant medicine (helps people abstain from smoking, but how this works is unknown):   Bupropion sustained-release (SR) tablets: Available by prescription.   Nicotinic receptor partial agonist (simulates the effect of nicotine in your brain):   Varenicline tartrate tablets: Available by prescription.   Ask your caregiver for advice about which medicines to use and how to use them. Carefully read the information on the package.    Everyone who is trying to quit may benefit from using a medicine. If you are pregnant or trying to become pregnant, nursing an infant, you are under age 18, or you smoke fewer than 10 cigarettes per day, talk to your caregiver before taking any nicotine replacement medicines.   You should stop using a nicotine replacement product and call your caregiver if you experience nausea, dizziness, weakness, vomiting, fast or irregular heartbeat, mouth problems with the lozenge or gum, or redness or swelling of the skin around the patch that does not go away.   Do not use any other product containing nicotine while using a nicotine replacement product.   Talk to your caregiver before using these products if you have diabetes, heart disease, asthma, stomach ulcers, you had a recent heart attack, you have high blood pressure that is not controlled with medicine, a history of irregular heartbeat, or you have been prescribed medicine to help you quit smoking.  5. BE PREPARED FOR RELAPSE OR   DIFFICULT SITUATIONS  Most relapses occur within the first 3 months after quitting. Do not be discouraged if you start smoking again. Remember, most people try several times before they finally quit.   You may have symptoms of withdrawal because your body is used to nicotine. You may crave cigarettes, be irritable, feel very hungry, cough often, get headaches, or have difficulty concentrating.   The withdrawal symptoms are only temporary. They are strongest when you first quit, but they will go away within 10 to 14 days.  Here are some difficult situations to watch for:  Alcohol. Avoid drinking alcohol. Drinking lowers your chances of successfully quitting.   Caffeine. Try to reduce the amount of caffeine you consume. It also lowers your chances of successfully quitting.   Other smokers. Being around smoking can make you want to smoke. Avoid smokers.   Weight gain. Many smokers will gain weight when they quit, usually  less than 10 pounds. Eat a healthy diet and stay active. Do not let weight gain distract you from your main goal, quitting smoking. Some medicines that help you quit smoking may also help delay weight gain. You can always lose the weight gained after you quit.   Bad mood or depression. There are a lot of ways to improve your mood other than smoking.  If you are having problems with any of these situations, talk to your caregiver. SPECIAL SITUATIONS OR CONDITIONS Studies suggest that everyone can quit smoking. Your situation or condition can give you a special reason to quit.  Pregnant women/New mothers: By quitting, you protect your baby's health and your own.   Hospitalized patients: By quitting, you reduce health problems and help healing.   Heart attack patients: By quitting, you reduce your risk of a second heart attack.   Lung, head, and neck cancer patients: By quitting, you reduce your chance of a second cancer.   Parents of children and adolescents: By quitting, you protect your children from illnesses caused by secondhand smoke.  QUESTIONS TO THINK ABOUT Think about the following questions before you try to stop smoking. You may want to talk about your answers with your caregiver.  Why do you want to quit?   If you tried to quit in the past, what helped and what did not?   What will be the most difficult situations for you after you quit? How will you plan to handle them?   Who can help you through the tough times? Your family? Friends? Caregiver?   What pleasures do you get from smoking? What ways can you still get pleasure if you quit?  Here are some questions to ask your caregiver:  How can you help me to be successful at quitting?   What medicine do you think would be best for me and how should I take it?   What should I do if I need more help?   What is smoking withdrawal like? How can I get information on withdrawal?  Quitting takes hard work and a lot of effort,  but you can quit smoking. FOR MORE INFORMATION Smokefree.gov (Inrails.tn) provides free, accurate, evidence-based information and professional assistance to help support the immediate and long-term needs of people trying to quit smoking. Document Released: 12/04/2001 Document Re-Released: 05/30/2010 [facility] Patient Information 2011 [place].Diabetes Meal Planning Guide The diabetes meal planning guide is a tool to help you plan your meals and snacks. It is important for people with diabetes to manage their blood sugar levels. Choosing the right  foods and the right amounts throughout your day will help control your blood sugar. Eating right can even help you improve your blood pressure and reach or maintain a healthy weight. CARBOHYDRATE COUNTING MADE EASY When you eat carbohydrates, they turn to sugar (glucose). This raises your blood sugar level. Counting carbohydrates can help you control this level so you feel better. When you plan your meals by counting carbohydrates, you can have more flexibility in what you eat and balance your medicine with your food intake. Carbohydrate counting simply means adding up the total amount of carbohydrate grams (g) in your meals or snacks. Try to eat about the same amount at each meal. Foods with carbohydrates are listed below. Each portion below is 1 carbohydrate serving or 15 grams of carbohydrates. Ask your dietician how many grams of carbohydrates you should eat at each meal or snack. Grains and Starches 1 slice bread 1/2 English muffin or hotdog/hamburger bun 3/4 cup cold cereal (unsweetened) 1/3 cup cooked pasta or rice 1/2 cup starchy vegetables (corn, potatoes, peas, beans, winter squash) 1 tortilla (6 inches) 1/4 bagel 1 waffle or pancake (size of a CD) 1/2 cup cooked cereal 4 to 6 small crackers *Whole grain is recommended Fruit 1 cup fresh unsweetened berries, melon, papaya, pineapple 1 small fresh fruit 1/2 banana or  mango 1/2 cup fruit juice (4 ounces unsweetened) 1/2 cup canned fruit in natural juice or water 2 tablespoons dried fruit 12 to 15 grapes or cherries Milk and Yogurt 1 cup fat-free or 1% milk 1 cup soy milk 6 ounces light yogurt with sugar-free sweetener 6 ounces low-fat soy yogurt 6 ounces plain yogurt Vegetables 1 cup raw or 1/2 cup cooked is counted as 0 carbohydrates or a "free" food. If you eat 3 or more servings at one meal, count them as 1 carbohydrate serving. Other Carbohydrates 3/4 ounces chips or pretzels 1/2 cup ice cream or frozen yogurt 1/4 cup sherbet or sorbet 2 inch square cake, no frosting 1 tablespoon honey, sugar, jam, jelly, or syrup 2 small cookies 3 squares of graham crackers 3 cups popcorn 6 crackers 1 cup broth-based soup Count 1 cup casserole or other mixed foods as 2 carbohydrate servings. Foods with less than 20 calories in a serving may be counted as 0 carbohydrates or a "free" food. You may want to purchase a book or computer software that lists the carbohydrate gram counts of different foods. In addition, the nutrition facts panel on the labels of the foods you eat are a good source of this information. The label will tell you how big the serving size is and the total number of carbohydrate grams you will be eating per serving. Divide this number by 15 to obtain the number of carbohydrate servings in a portion. Remember: 1 carbohydrate serving equals 15 grams of carbohydrate. SERVING SIZES Measuring foods and serving sizes helps you make sure you are getting the right amount of food. The list below tells how big or small some common serving sizes are.  1 ounce (oz) of cheese.................................4 stacked dice.   2 to 3 oz cooked meat.................................Marland KitchenDeck of cards.   1 teaspoon (tsp)...........................................Marland KitchenTip of little finger.   1 tablespoon (tbs).......................................Marland KitchenMarland KitchenThumb.   2  tbs............................................................Marland KitchenGolf ball.    cup..........................................................Marland KitchenHalf of a fist.   1 cup...........................................................Marland KitchenA fist.  SAMPLE DIABETES MEAL PLAN Below is a sample meal plan that includes foods from the grain and starches, dairy, vegetable, fruit, and meat groups. A dietician can individualize a meal plan to fit your  calorie needs and tell you the number of servings needed from each food group. However, controlling the total amount of carbohydrates in your meal or snack is more important than making sure you include all of the food groups at every meal. You may interchange carbohydrate containing foods (dairy, starches, and fruits). The meal plan below is an example of a 2000 calorie diet using carbohydrate counting. This meal plan has 17 carbohydrate servings (carb choices). Breakfast 1 cup oatmeal (2 carb choices) 3/4 cup light yogurt (1 carb choice) 1 cup blueberries (1 carb choice) 1/4 cup almonds  Snack 1 large apple (2 carb choices) 1 low-fat string cheese stick  Lunch Chicken breast salad:  1 cup spinach   1/4 cup chopped tomatoes   2 oz chicken breast, sliced   2 tbs low-fat [place] dressing  12 whole-wheat crackers (2 carb choices) 12 to 15 grapes (1 carb choice) 1 cup low-fat milk (1 carb choice)  Snack 1 cup carrots 1/2 cup hummus (1 carb choice)  Dinner 3 oz broiled salmon 1 cup brown rice (3 carb choices)  Snack 1 1/2 cups steamed broccoli (1 carb choice) drizzled with 1 tsp olive oil and lemon juice 1 cup light pudding (2 carb choices)  DIABETES MEAL PLANNING WORKSHEET Your dietician can use this worksheet to help you decide how many servings of foods and what types of foods are right for you.  Breakfast Food Group and Servings Carb Choices Grain/Starches _______________________________________ Dairy  ______________________________________________ Vegetable _______________________________________ Fruit _______________________________________________ Meat _______________________________________________ Fat _____________________________________________ Lunch Food Group and Servings Carb Choices Grain/Starches ________________________________________ Dairy _______________________________________________ Fruit ________________________________________________ Meat ________________________________________________ Fat _____________________________________________ Dinner Food Group and Servings Carb Choices Grain/Starches ________________________________________ Dairy _______________________________________________ Fruit ________________________________________________ Meat ________________________________________________ Fat _____________________________________________ Snacks Food Group and Servings Carb Choices Grain/Starches ________________________________________ Dairy _______________________________________________ Vegetable ________________________________________ Fruit ________________________________________________ Meat ________________________________________________ Fat _____________________________________________ Daily Totals Starches _________________________ Vegetable __________________________ Fruit ______________________________ Dairy ______________________________ Meat ______________________________ Fat ________________________________  Document Released: 09/06/2005 Document Re-Released: 05/30/2010 [employer] Patient Information 2011 [place], [place].   Consider DASH diet

## 2011-07-24 MED ORDER — ASPIRIN EC 81 MG PO TBEC: 81.0000 mg | DELAYED_RELEASE_TABLET | Freq: Every day | ORAL | Status: AC

## 2011-07-24 NOTE — Assessment & Plan Note (Signed)
Avoid trans fats and monitor lipids

## 2011-07-24 NOTE — Assessment & Plan Note (Signed)
Symptoms greatly improved since last visit. He is using Spiriva daily but did stop the Advair as directed. Has not needed any insulin in the last week. No significant wheezing or shortness of breath.

## 2011-07-24 NOTE — Assessment & Plan Note (Signed)
Good response to Zantac, has stopped the Omeprazole because she did not find that helpful, avoid offending foods

## 2011-07-24 NOTE — Assessment & Plan Note (Signed)
Tolerating current dose of Levothyroxine will continue to monitor

## 2011-07-24 NOTE — Progress Notes (Signed)
Barbara Phelps [id number] Sep 15, 1932 07/24/2011      Progress Note-Follow Up  Subjective  Chief Complaint  Chief Complaint  Patient presents with  . Follow-up    2 week follow up    HPI  He should send her for follow up on her nystatin ointment feeling significantly better. She denies any presents trouble with wheezing or shortness of breath. Her cough is still present but dry and less frequent. Zantac has helped her reflux and she is no longer taking the omeprazole. Has not had any heartburn episodes since her last visit. Has not needed any albuterol for the last 1-2 weeks and she denies chest pain, palpitations, fevers, chills, GI or GU complaints at this time. She has a history of osteoporosis but has not had a bone densitometry in 15 years. Says he felt he would not milligrams twice a day and vitamin D thousand international units twice a day. Also takes fish oil 2 caps daily. Last mammogram was 10 years ago she declines any others due to the pain offers no complaints of breast tenderness lesions or discharge. Last Pap was 5 years ago and was normal she had an abnormal requiring procedure 7 years. Colonoscopy was last 8-10 years ago declines another at this time is not having any bowel difficulties  Past Medical History  Diagnosis Date  . Cigarette smoker   . Chicken pox as a child  . Measles as a child  . Mumps as a child  . Anxiety     panic attacks  . Cataract   . COPD (chronic obstructive pulmonary disease)   . Emphysema of lung   . Glaucoma   . Thyroid disease   . Peripheral vascular disease   . Osteoporosis   . Neuromuscular disorder   . Guillain-Barre syndrome     1995, 2 1/2 months in hospital  . Tobacco abuse disorder   . Pneumonia     June 2012, left side  . Reflux 07/04/2011  . Diabetes mellitus 07/06/2011  . Hyperlipidemia 07/19/2011    Past Surgical History  Procedure Date  . Cataract extraction, bilateral     Family History  Problem Relation Age of  Onset  . Cancer Mother     lung/ nonsmoker  . Heart attack Father   . Hypertension Father   . Hypertension Sister   . Hyperlipidemia Sister   . Arthritis Sister   . Hypertension Brother   . COPD Brother     smoker  . Emphysema Brother   . Diabetes Paternal Grandfather   . Diabetes Sister     type 2  . Heart disease Sister   . Hypertension Sister   . Hyperlipidemia Sister   . Lupus Sister   . Kidney disease Sister   . Cancer Brother 39    brain   . Cancer Brother 56    melonoma  . Hypertension Brother   . Heart disease Brother   . Cancer Brother 51    lung/ smoker  . Cancer Brother 36    pancreatic/ smoker  . Other Daughter     spinal bifida  . Fibromyalgia Daughter   . Cancer Son 85    liver    History   Social History  . Marital Status: Widowed    Spouse Name: N/A    Number of Children: N/A  . Years of Education: N/A   Occupational History  . Not on file.   Social History Main Topics  . Smoking status: Current  Everyday Smoker -- 0.5 packs/day for 60 years    Types: Cigarettes  . Smokeless tobacco: Never Used  . Alcohol Use: No  . Drug Use: No  . Sexually Active: No   Other Topics Concern  . Not on file   Social History Narrative  . No narrative on file    Current Outpatient Prescriptions on File Prior to Visit  Medication Sig Dispense Refill  . albuterol (PROVENTIL) (2.5 MG/3ML) 0.083% nebulizer solution Take 2.5 mg by nebulization 2 (two) times daily as needed.        Marland Kitchen albuterol (VENTOLIN HFA) 108 (90 BASE) MCG/ACT inhaler Inhale 2 puffs into the lungs every 4 (four) hours as needed.        . ALPRAZolam (XANAX) 0.5 MG tablet Take 1 tablet (0.5 mg total) by mouth at bedtime as needed. Take 1-2  45 tablet  0  . brinzolamide (AZOPT) 1 % ophthalmic suspension Place 1 drop into both eyes 2 (two) times daily.        Marland Kitchen levothyroxine (SYNTHROID, LEVOTHROID) 100 MCG tablet Take 100 mcg by mouth daily.        . ranitidine (ZANTAC) 300 MG tablet Take 1  tablet (300 mg total) by mouth at bedtime.  30 tablet  1  . tiotropium (SPIRIVA) 18 MCG inhalation capsule Place 18 mcg into inhaler and inhale daily.        . travoprost, benzalkonium, (TRAVATAN) 0.004 % ophthalmic solution Place 1 drop into both eyes at bedtime.        . triamcinolone (KENALOG) 0.1 % cream Apply 1 application topically 2 (two) times daily.          No Known Allergies  Review of Systems  Review of Systems  Constitutional: Negative for fever and malaise/fatigue.  HENT: Positive for congestion.   Eyes: Negative for discharge.  Respiratory: Positive for cough. Negative for sputum production and shortness of breath.   Cardiovascular: Negative for chest pain, palpitations and leg swelling.  Gastrointestinal: Negative for nausea, vomiting, abdominal pain, diarrhea, constipation and blood in stool.  Genitourinary: Negative for dysuria.  Musculoskeletal: Negative for myalgias and falls.  Skin: Negative for rash.  Neurological: Negative for loss of consciousness and headaches.  Endo/Heme/Allergies: Negative for polydipsia.  Psychiatric/Behavioral: Negative for depression and suicidal ideas. The patient is not nervous/anxious and does not have insomnia.     Objective  BP 135/73  Pulse 85  Temp(Src) 97.6 F (36.4 C) (Oral)  Ht 5\' 8"  (1.727 m)  Wt 101 lb 12.8 oz (46.176 kg)  BMI 15.48 kg/m2  SpO2 97%  Physical Exam  Physical Exam  Constitutional: She is oriented to person, place, and time and well-developed, well-nourished, and in no distress. No distress.       thin  HENT:  Head: Normocephalic and atraumatic.  Eyes: Conjunctivae are normal.  Neck: Neck supple. No thyromegaly present.  Cardiovascular: Normal rate, regular rhythm and normal heart sounds.   Pulmonary/Chest: Effort normal and breath sounds normal. She has no wheezes.       Prolonged expiratory phase  Abdominal: She exhibits no distension and no mass.  Musculoskeletal: She exhibits no edema.    Lymphadenopathy:    She has no cervical adenopathy.  Neurological: She is alert and oriented to person, place, and time.  Skin: Skin is warm and dry. No rash noted. She is not diaphoretic.  Psychiatric: Memory, affect and judgment normal.    Lab Results  Component Value Date   TSH 0.43 07/19/2011  Lab Results  Component Value Date   WBC 9.2 07/19/2011   HGB 16.2* 07/19/2011   HCT 48.9* 07/19/2011   MCV 98.4 07/19/2011   PLT 191.0 07/19/2011   Lab Results  Component Value Date   CREATININE 0.8 07/19/2011   BUN 10 07/19/2011   NA 134* 07/19/2011   K 4.6 07/19/2011   CL 98 07/19/2011   CO2 27 07/19/2011   Lab Results  Component Value Date   ALT 13 07/19/2011   AST 18 07/19/2011   ALKPHOS 93 07/19/2011   BILITOT 0.7 07/19/2011   Lab Results  Component Value Date   CHOL 203* 07/19/2011   Lab Results  Component Value Date   HDL 70.90 07/19/2011   No results found for this basename: LDLCALC   Lab Results  Component Value Date   TRIG 97.0 07/19/2011   Lab Results  Component Value Date   CHOLHDL 3 07/19/2011     Assessment & Plan  Tobacco abuse disorder Unfortunately the patient continues to smoke roughly 1/2 to 1 ppd she is aware that she needs to quit but has been unable to quit with previous attempts. Agrees to try some over-the-counter nicotine supplements and we will reassess at our next visit. She is counseled for greater than 3 minutes regarding the need for complete cessation.  Peripheral vascular disease Has previously had a nonhealing wound in her right leg and peripheral vascular disease was confirmed with ultrasound roughly 3-4 years ago. She is once again made aware that she needs to quit smoking in order to keep this from progressing. She stresses understanding and will attempt cessation prior to next visit. She is asked to start an 81 mg aspirin daily at this time.  Reflux Good response to Zantac, has stopped the Omeprazole because she did not find that helpful,  avoid offending foods  Thyroid disease Tolerating current dose of Levothyroxine will continue to monitor  Diabetes mellitus Needs labs for monitoring and avoid simple carbs.  Hyperlipidemia Avoid trans fats and monitor lipids  Osteoporosis Agrees to repeat bone Densitometry does not believe she has had one in 15 years. Last colonoscopy was 8 to 10 years ago is unsure if she would like to proceed with another. Last MG 10 years ago and she declines another. Last pap about 5 years ago and normal but the one before that 7 years ago was not agrees to repeat pap at next visit.  COPD (chronic obstructive pulmonary disease) Symptoms greatly improved since last visit. He is using Spiriva daily but did stop the Advair as directed. Has not needed any insulin in the last week. No significant wheezing or shortness of breath.

## 2011-07-24 NOTE — Assessment & Plan Note (Signed)
Needs labs for monitoring and avoid simple carbs.

## 2011-07-24 NOTE — Assessment & Plan Note (Signed)
Has previously had a nonhealing wound in her right leg and peripheral vascular disease was confirmed with ultrasound roughly 3-4 years ago. She is once again made aware that she needs to quit smoking in order to keep this from progressing. She stresses understanding and will attempt cessation prior to next visit. She is asked to start an 81 mg aspirin daily at this time.

## 2011-07-24 NOTE — Assessment & Plan Note (Signed)
Agrees to repeat bone Densitometry does not believe she has had one in 15 years. Last colonoscopy was 8 to 10 years ago is unsure if she would like to proceed with another. Last MG 10 years ago and she declines another. Last pap about 5 years ago and normal but the one before that 7 years ago was not agrees to repeat pap at next visit.

## 2011-07-24 NOTE — Assessment & Plan Note (Signed)
Unfortunately the patient continues to smoke roughly 1/2 to 1 ppd she is aware that she needs to quit but has been unable to quit with previous attempts. Agrees to try some over-the-counter nicotine supplements and we will reassess at our next visit. She is counseled for greater than 3 minutes regarding the need for complete cessation.

## 2011-08-20 ENCOUNTER — Telehealth: Payer: Self-pay

## 2011-08-20 ENCOUNTER — Ambulatory Visit (INDEPENDENT_AMBULATORY_CARE_PROVIDER_SITE_OTHER): Payer: Medicare Other | Admitting: Family Medicine

## 2011-08-20 ENCOUNTER — Encounter: Payer: Self-pay | Admitting: Family Medicine

## 2011-08-20 DIAGNOSIS — F17200 Nicotine dependence, unspecified, uncomplicated: Secondary | ICD-10-CM

## 2011-08-20 DIAGNOSIS — E079 Disorder of thyroid, unspecified: Secondary | ICD-10-CM

## 2011-08-20 DIAGNOSIS — IMO0001 Reserved for inherently not codable concepts without codable children: Secondary | ICD-10-CM

## 2011-08-20 DIAGNOSIS — F419 Anxiety disorder, unspecified: Secondary | ICD-10-CM

## 2011-08-20 DIAGNOSIS — E119 Type 2 diabetes mellitus without complications: Secondary | ICD-10-CM

## 2011-08-20 DIAGNOSIS — J449 Chronic obstructive pulmonary disease, unspecified: Secondary | ICD-10-CM

## 2011-08-20 DIAGNOSIS — J4489 Other specified chronic obstructive pulmonary disease: Secondary | ICD-10-CM

## 2011-08-20 DIAGNOSIS — G4700 Insomnia, unspecified: Secondary | ICD-10-CM

## 2011-08-20 DIAGNOSIS — Z720 Tobacco use: Secondary | ICD-10-CM

## 2011-08-20 DIAGNOSIS — R634 Abnormal weight loss: Secondary | ICD-10-CM

## 2011-08-20 DIAGNOSIS — K219 Gastro-esophageal reflux disease without esophagitis: Secondary | ICD-10-CM

## 2011-08-20 DIAGNOSIS — F411 Generalized anxiety disorder: Secondary | ICD-10-CM

## 2011-08-20 DIAGNOSIS — D171 Benign lipomatous neoplasm of skin and subcutaneous tissue of trunk: Secondary | ICD-10-CM

## 2011-08-20 DIAGNOSIS — D1779 Benign lipomatous neoplasm of other sites: Secondary | ICD-10-CM

## 2011-08-20 MED ORDER — ALBUTEROL SULFATE (2.5 MG/3ML) 0.083% IN NEBU: 2.5000 mg | INHALATION_SOLUTION | Freq: Two times a day (BID) | RESPIRATORY_TRACT | Status: DC | PRN

## 2011-08-20 MED ORDER — LISINOPRIL 2.5 MG PO TABS: 2.5000 mg | ORAL_TABLET | Freq: Every day | ORAL | Status: DC

## 2011-08-20 MED ORDER — ALPRAZOLAM 0.5 MG PO TABS: 0.5000 mg | ORAL_TABLET | Freq: Every evening | ORAL | Status: DC | PRN

## 2011-08-20 NOTE — Telephone Encounter (Signed)
Please call [facility] and give them an order to supply the patient with new tubing and mask for her nebulizer

## 2011-08-20 NOTE — Assessment & Plan Note (Signed)
HGBA1C 6.2, no changes patient

## 2011-08-20 NOTE — Telephone Encounter (Signed)
I got it, please place an order with [facility] to go out and supply her with new tube ad mask for her Nebulizer. Dx COPD exacerbation

## 2011-08-20 NOTE — Telephone Encounter (Signed)
Pt called stating that the name of her ventillator is [facility]. Pt also stated the albuterol wasn't at the pharmacy. I resent the Albuterol.

## 2011-08-20 NOTE — Telephone Encounter (Signed)
I called [facility] and there is 2 companies in [place] - [employer] [phone] [id number] [phone]  Patient needs a RX then can take it to one of these stores to get equipment

## 2011-08-20 NOTE — Patient Instructions (Signed)
Chronic Obstructive Pulmonary Disease (COPD) Chronic obstructive pulmonary disease (COPD) is a condition in which airflow from the lungs is restricted. The lungs can never return to normal, but there are measures you can take which will improve them and make you feel better. CAUSES  Smoking.   Breathing in irritants (pollution, cigarette smoke, strong odors, aerosol sprays, paint fumes).   History of lung infections.  TREATMENT  Treatment focuses on making you comfortable (supportive care).  HOME CARE INSTRUCTIONS  If you smoke, stop smoking. The carbon monoxide buildup in the blood robs you of your already short oxygen supply.   Take medicines (antibiotics) that kill germs as directed.   Avoid antihistamines and cough syrups. They dry up your system and slow down the elimination of secretions. This decreases respiratory capacity and may lead to infections.   Drink enough water and fluids to keep your urine clear or pale yellow. This loosens secretions.   Use humidifiers at home and at your bedside if they do not make breathing difficult.   Receive all protective vaccines your caregiver suggests, especially pneumococcal and influenza.   Use home oxygen as suggested.  SEEK MEDICAL CARE IF:  You develop pus-like mucus (sputum).   You have an oral temperature above 104.   Breathing is more labored or exercise becomes difficult to do.   You are running out of the medicine you take for your breathing.  SEEK IMMEDIATE MEDICAL CARE IF:  You have a rapid heart rate.   You have agitation, confusion, tremors, or are in a stupor (family members may need to observe this).   It becomes difficult to breathe.   You develop chest pain.   You have an oral temperature above 104, not controlled by medicine.  MAKE SURE YOU:   Understand these instructions.   Will watch your condition.   Will get help right away if you are not doing well or get worse.  Document Released: 09/19/2005  Document Re-Released: 03/06/2010 [facility] Patient Information 2011 [place], [place].  Please look at your nebulizer (breathing machine) see if you can find a label that tells [place] which home supply company provided it and call us so we can have them bring you a new mask

## 2011-08-21 ENCOUNTER — Encounter: Payer: Self-pay | Admitting: Family Medicine

## 2011-08-21 DIAGNOSIS — R634 Abnormal weight loss: Secondary | ICD-10-CM

## 2011-08-21 DIAGNOSIS — D1779 Benign lipomatous neoplasm of other sites: Secondary | ICD-10-CM | POA: Insufficient documentation

## 2011-08-21 HISTORY — DX: Abnormal weight loss: R63.4

## 2011-08-21 NOTE — Assessment & Plan Note (Signed)
Has been present for a month or 2 is not painful, inflamed or red, patient will report if the lesion grows or becomes irritated so we can have it removed.

## 2011-08-21 NOTE — Assessment & Plan Note (Signed)
Symptomatically improved. Patient was recently in [place] helping her adult daughter with Spina Bifida when she underwent a surgery. She was under a great deal of stress and she had a COPD exacerbation. She had to be seen at an urgent care center and she was given a nebulizer at that visit. She is requesting a refill on her Albuterol rx for the nebulizer secondary to she feels that works better for her this is given, she also needs new tubing and a mask will set up an order with the medical supply company that provided the neb.

## 2011-08-21 NOTE — Assessment & Plan Note (Signed)
No recent episodes. No changes to meds at present, tolerating Ranitidine and using it as needed.

## 2011-08-21 NOTE — Assessment & Plan Note (Signed)
Patient believes she is eating better at present, she is encouraged to eat what ever she wants at present and not to worry about her sugars, she needs healthy fats, lean proteins, and complex carbs. Add  A carnation instant breakfast drink daily

## 2011-08-21 NOTE — Assessment & Plan Note (Addendum)
Patient is given a refill on her Alprazolam to help her manage her daily stressors with her daughter. She is hesitant to start a daily medication to manage her stress.

## 2011-08-21 NOTE — Assessment & Plan Note (Addendum)
Unfortunately continues to smoke. Patient counseled and encouraged to quit for greater than 3 minutes but patient is not interested at this time.

## 2011-08-21 NOTE — Assessment & Plan Note (Signed)
Stable on current dose of levothyroxine, will check labs.

## 2011-08-21 NOTE — Telephone Encounter (Signed)
I spoke with customer service at [facility] and they need an RX faxed to them along with a demographic sheet and then they will send the equipment to the patient. I already printed the demographic sheet. Please advise?

## 2011-08-21 NOTE — Telephone Encounter (Signed)
faxed

## 2011-08-21 NOTE — Progress Notes (Signed)
Barbara Phelps [id number] Dec 04, 1932 08/21/2011      Progress Note-Follow Up  Subjective  Chief Complaint  Chief Complaint  Patient presents with  . Follow-up    1 month follow up    HPI  Patient is here today in followup. She's recently been to [employer] health care for her adult daughter with spina bifida who had to undergo surgical procedure. While there she was under a great deal of stress and very anxious to get her alprazolam frequently. As he was working well for her and she has no concerning side effects. Unfortunately while there she also had a COPD exacerbation which required a visit to a urgent care center. She was given albuterol and a nebulizer and feels that that works better for her than an HFA. She would like a prescription for the nebulizer treatments and her neb tubing. No other c/o today. She reports she did not eat well while at her daughter's but she is eating better now that she is home. She denies any fevers/chills/CP/palp/GI or GU c/o. Has a mild nonproductive cough. No significant nasal congestion.  Past Medical History  Diagnosis Date  . Cigarette smoker   . Chicken pox as a child  . Measles as a child  . Mumps as a child  . Anxiety     panic attacks  . Cataract   . COPD (chronic obstructive pulmonary disease)   . Emphysema of lung   . Glaucoma   . Thyroid disease   . Peripheral vascular disease   . Osteoporosis   . Neuromuscular disorder   . Guillain-Barre syndrome     1995, 2 1/2 months in hospital  . Tobacco abuse disorder   . Pneumonia     June 2012, left side  . Reflux 07/04/2011  . Diabetes mellitus 07/06/2011  . Hyperlipidemia 07/19/2011    Past Surgical History  Procedure Date  . Cataract extraction, bilateral     Family History  Problem Relation Age of Onset  . Cancer Mother     lung/ nonsmoker  . Heart attack Father   . Hypertension Father   . Hypertension Sister   . Hyperlipidemia Sister   . Arthritis Sister   . Hypertension  Brother   . COPD Brother     smoker  . Emphysema Brother   . Diabetes Paternal Grandfather   . Diabetes Sister     type 2  . Heart disease Sister   . Hypertension Sister   . Hyperlipidemia Sister   . Lupus Sister   . Kidney disease Sister   . Cancer Brother 39    brain   . Cancer Brother 68    melonoma  . Hypertension Brother   . Heart disease Brother   . Cancer Brother 76    lung/ smoker  . Cancer Brother 64    pancreatic/ smoker  . Other Daughter     spinal bifida  . Fibromyalgia Daughter   . Cancer Son 47    liver    History   Social History  . Marital Status: Widowed    Spouse Name: N/A    Number of Children: N/A  . Years of Education: N/A   Occupational History  . Not on file.   Social History Main Topics  . Smoking status: Current Everyday Smoker -- 0.5 packs/day for 60 years    Types: Cigarettes  . Smokeless tobacco: Never Used  . Alcohol Use: No  . Drug Use: No  . Sexually Active: No  Other Topics Concern  . Not on file   Social History Narrative  . No narrative on file    Current Outpatient Prescriptions on File Prior to Visit  Medication Sig Dispense Refill  . albuterol (VENTOLIN HFA) 108 (90 BASE) MCG/ACT inhaler Inhale 2 puffs into the lungs every 4 (four) hours as needed.        Marland Kitchen aspirin EC 81 MG tablet Take 1 tablet (81 mg total) by mouth daily.  150 tablet  2  . brinzolamide (AZOPT) 1 % ophthalmic suspension Place 1 drop into both eyes 2 (two) times daily.        Marland Kitchen levothyroxine (SYNTHROID, LEVOTHROID) 100 MCG tablet Take 100 mcg by mouth daily.        . ranitidine (ZANTAC) 300 MG tablet Take 1 tablet (300 mg total) by mouth at bedtime.  30 tablet  1  . tiotropium (SPIRIVA) 18 MCG inhalation capsule Place 18 mcg into inhaler and inhale daily.        . travoprost, benzalkonium, (TRAVATAN) 0.004 % ophthalmic solution Place 1 drop into both eyes at bedtime.        . triamcinolone (KENALOG) 0.1 % cream Apply 1 application topically 2 (two)  times daily.          No Known Allergies  Review of Systems  Review of Systems  Constitutional: Positive for malaise/fatigue. Negative for fever, chills and weight loss.  HENT: Negative for congestion.   Eyes: Negative for discharge.  Respiratory: Positive for cough and wheezing. Negative for hemoptysis, shortness of breath and stridor.   Cardiovascular: Negative for chest pain, palpitations and leg swelling.  Gastrointestinal: Negative for nausea, vomiting, abdominal pain, diarrhea, blood in stool and melena.  Genitourinary: Negative for dysuria.  Musculoskeletal: Negative for falls.  Skin: Negative for rash.  Neurological: Negative for loss of consciousness and headaches.  Endo/Heme/Allergies: Negative for polydipsia.  Psychiatric/Behavioral: Negative for depression and suicidal ideas. The patient is nervous/anxious. The patient does not have insomnia.     Objective  BP 133/79  Pulse 70  Temp(Src) 97.6 F (36.4 C) (Oral)  Ht 5\' 8"  (1.727 m)  Wt 99 lb 12.8 oz (45.269 kg)  BMI 15.17 kg/m2  SpO2 98%  Physical Exam  Physical Exam  Constitutional: She is oriented to person, place, and time. No distress.       Cachectic  HENT:  Head: Normocephalic and atraumatic.  Nose: Nose normal.  Mouth/Throat: Oropharynx is clear and moist. No oropharyngeal exudate.  Eyes: Conjunctivae are normal.  Neck: Neck supple. No thyromegaly present.  Cardiovascular: Normal rate, regular rhythm and normal heart sounds.   No murmur heard. Pulmonary/Chest: Effort normal. She has no wheezes.       Prolonged expiratory phase, decreased BS at bases  Abdominal: She exhibits no distension and no mass.  Musculoskeletal: She exhibits no edema.  Lymphadenopathy:    She has no cervical adenopathy.  Neurological: She is alert and oriented to person, place, and time.  Skin: Skin is warm and dry. No rash noted. She is not diaphoretic.  Psychiatric: Memory, affect and judgment normal.    Lab Results    Component Value Date   TSH 0.43 07/19/2011   Lab Results  Component Value Date   WBC 9.2 07/19/2011   HGB 16.2* 07/19/2011   HCT 48.9* 07/19/2011   MCV 98.4 07/19/2011   PLT 191.0 07/19/2011   Lab Results  Component Value Date   CREATININE 0.8 07/19/2011   BUN 10 07/19/2011  NA 134* 07/19/2011   K 4.6 07/19/2011   CL 98 07/19/2011   CO2 27 07/19/2011   Lab Results  Component Value Date   ALT 13 07/19/2011   AST 18 07/19/2011   ALKPHOS 93 07/19/2011   BILITOT 0.7 07/19/2011   Lab Results  Component Value Date   CHOL 203* 07/19/2011   Lab Results  Component Value Date   HDL 70.90 07/19/2011   No results found for this basename: [facility]   Lab Results  Component Value Date   TRIG 97.0 07/19/2011   Lab Results  Component Value Date   CHOLHDL 3 07/19/2011     Assessment & Plan  Diabetes mellitus HGBA1C 6.2, no changes patient   Tobacco abuse disorder Unfortunately continues to smoke. Patient counseled and encouraged to quit for greater than 3 minutes but patient is not interested at this time.  Lipoma of lumbosacral region Has been present for a month or 2 is not painful, inflamed or red, patient will report if the lesion grows or becomes irritated so we can have it removed.   COPD (chronic obstructive pulmonary disease) Symptomatically improved. Patient was recently in [place] helping her adult daughter with Spina Bifida when she underwent a surgery. She was under a great deal of stress and she had a COPD exacerbation. She had to be seen at an urgent care center and she was given a nebulizer at that visit. She is requesting a refill on her Albuterol rx for the nebulizer secondary to she feels that works better for her this is given, she also needs new tubing and a mask will set up an order with the medical supply company that provided the neb.  Anxiety Patient is given a refill on her Alprazolam to help her manage her daily stressors with her daughter. She is hesitant to  start a daily medication to manage her stress.   Reflux No recent episodes. No changes to meds at present, tolerating Ranitidine and using it as needed.   Thyroid disease Stable on current dose of levothyroxine, will check labs.  Weight loss Patient believes she is eating better at present, she is encouraged to eat what ever she wants at present and not to worry about her sugars, she needs healthy fats, lean proteins, and complex carbs. Add  A carnation instant breakfast drink daily

## 2011-08-21 NOTE — Telephone Encounter (Signed)
Prescription for tubing and mask written, please fax requested info

## 2011-08-22 ENCOUNTER — Other Ambulatory Visit: Payer: Self-pay

## 2011-08-22 DIAGNOSIS — J449 Chronic obstructive pulmonary disease, unspecified: Secondary | ICD-10-CM

## 2011-08-22 MED ORDER — ALBUTEROL SULFATE (2.5 MG/3ML) 0.083% IN NEBU: 2.5000 mg | INHALATION_SOLUTION | Freq: Two times a day (BID) | RESPIRATORY_TRACT | Status: AC | PRN

## 2011-08-22 NOTE — Telephone Encounter (Signed)
Sent yesterday while she was here.

## 2011-08-22 NOTE — Telephone Encounter (Signed)
Patient left a message stating that her new mask and tubes for nebulizer would be delivered tomorrow but she needs a refill of the medication for the nebulizer sent to [facility]?

## 2011-09-10 ENCOUNTER — Other Ambulatory Visit: Payer: Self-pay

## 2011-09-10 MED ORDER — LEVOTHYROXINE SODIUM 100 MCG PO TABS: 100.0000 ug | ORAL_TABLET | Freq: Every day | ORAL | Status: DC

## 2011-10-04 ENCOUNTER — Other Ambulatory Visit: Payer: Self-pay | Admitting: Family Medicine

## 2011-11-05 ENCOUNTER — Ambulatory Visit (INDEPENDENT_AMBULATORY_CARE_PROVIDER_SITE_OTHER): Payer: Medicare Other | Admitting: Family Medicine

## 2011-11-05 ENCOUNTER — Encounter: Payer: Self-pay | Admitting: Family Medicine

## 2011-11-05 DIAGNOSIS — E785 Hyperlipidemia, unspecified: Secondary | ICD-10-CM

## 2011-11-05 DIAGNOSIS — Z720 Tobacco use: Secondary | ICD-10-CM

## 2011-11-05 DIAGNOSIS — J449 Chronic obstructive pulmonary disease, unspecified: Secondary | ICD-10-CM

## 2011-11-05 DIAGNOSIS — IMO0002 Reserved for concepts with insufficient information to code with codable children: Secondary | ICD-10-CM

## 2011-11-05 DIAGNOSIS — F411 Generalized anxiety disorder: Secondary | ICD-10-CM

## 2011-11-05 DIAGNOSIS — F17200 Nicotine dependence, unspecified, uncomplicated: Secondary | ICD-10-CM

## 2011-11-05 DIAGNOSIS — E039 Hypothyroidism, unspecified: Secondary | ICD-10-CM

## 2011-11-05 DIAGNOSIS — E079 Disorder of thyroid, unspecified: Secondary | ICD-10-CM

## 2011-11-05 DIAGNOSIS — T148XXA Other injury of unspecified body region, initial encounter: Secondary | ICD-10-CM

## 2011-11-05 DIAGNOSIS — F419 Anxiety disorder, unspecified: Secondary | ICD-10-CM

## 2011-11-05 DIAGNOSIS — D582 Other hemoglobinopathies: Secondary | ICD-10-CM

## 2011-11-05 DIAGNOSIS — I10 Essential (primary) hypertension: Secondary | ICD-10-CM

## 2011-11-05 DIAGNOSIS — E119 Type 2 diabetes mellitus without complications: Secondary | ICD-10-CM

## 2011-11-05 DIAGNOSIS — R634 Abnormal weight loss: Secondary | ICD-10-CM

## 2011-11-05 LAB — CBC
HCT: 47.6 % — ABNORMAL HIGH (ref 36.0–46.0)
Hemoglobin: 16.3 g/dL — ABNORMAL HIGH (ref 12.0–15.0)
RBC: 4.99 MIL/uL (ref 3.87–5.11)
RDW: 12.7 % (ref 11.5–15.5)
WBC: 7.2 10*3/uL (ref 4.0–10.5)

## 2011-11-05 MED ORDER — MUPIROCIN 2 % EX OINT: TOPICAL_OINTMENT | CUTANEOUS | Status: AC

## 2011-11-05 NOTE — Assessment & Plan Note (Signed)
Encouraged avoid trans fats and  Repeat a lipid panel today

## 2011-11-05 NOTE — Assessment & Plan Note (Signed)
Repeat TSH today

## 2011-11-05 NOTE — Assessment & Plan Note (Signed)
Last hgba1c well controlled, will repeat today

## 2011-11-05 NOTE — Assessment & Plan Note (Signed)
Reports she is eating better, has gained slightly

## 2011-11-05 NOTE — Progress Notes (Signed)
Barbara Phelps [id number] [birth year] 11/05/2011      Progress Note-Follow Up  Subjective  Chief Complaint  Chief Complaint  Patient presents with  . Follow-up  . Labs    A1C, lipids, liver, renal, tsh, cbc    HPI  Patient is a 77 yo caucasian female in today for follow up on multiple medical problems. She reports her sugars have been in the normal range and she denies any polyuria or polydipsia, no abdominal pain. No recent illness, fevers, congestion, cp, palp, sob, gi or gu c/o. She reports taking her medications as prescribed  Past Medical History  Diagnosis Date  . Cigarette smoker   . Chicken pox as a child  . Measles as a child  . Mumps as a child  . Anxiety     panic attacks  . Cataract   . COPD (chronic obstructive pulmonary disease)   . Emphysema of lung   . Glaucoma   . Thyroid disease   . Peripheral vascular disease   . Osteoporosis   . Neuromuscular disorder   . Guillain-Barre syndrome     1995, 2 1/2 months in hospital  . Tobacco abuse disorder   . Pneumonia     June 2012, left side  . Reflux 07/04/2011  . Diabetes mellitus 07/06/2011  . Hyperlipidemia 07/19/2011  . Weight loss 08/21/2011    Past Surgical History  Procedure Date  . Cataract extraction, bilateral     Family History  Problem Relation Age of Onset  . Cancer Mother     lung/ nonsmoker  . Heart attack Father   . Hypertension Father   . Hypertension Sister   . Hyperlipidemia Sister   . Arthritis Sister   . Hypertension Brother   . COPD Brother     smoker  . Emphysema Brother   . Diabetes Paternal Grandfather   . Diabetes Sister     type 2  . Heart disease Sister   . Hypertension Sister   . Hyperlipidemia Sister   . Lupus Sister   . Kidney disease Sister   . Cancer Brother 39    brain   . Cancer Brother 62    melonoma  . Hypertension Brother   . Heart disease Brother   . Cancer Brother 47    lung/ smoker  . Cancer Brother 98    pancreatic/ smoker  . Other Daughter       spinal bifida  . Fibromyalgia Daughter   . Cancer Son 63    liver    History   Social History  . Marital Status: Widowed    Spouse Name: N/A    Number of Children: N/A  . Years of Education: N/A   Occupational History  . Not on file.   Social History Main Topics  . Smoking status: Current Everyday Smoker -- 0.5 packs/day for 60 years    Types: Cigarettes  . Smokeless tobacco: Never Used  . Alcohol Use: No  . Drug Use: No  . Sexually Active: No   Other Topics Concern  . Not on file   Social History Narrative  . No narrative on file    Current Outpatient Prescriptions on File Prior to Visit  Medication Sig Dispense Refill  . albuterol (PROVENTIL) (2.5 MG/3ML) 0.083% nebulizer solution Take 2.5 mg by nebulization 2 (two) times daily as needed.  75 mL  5  . albuterol (VENTOLIN HFA) 108 (90 BASE) MCG/ACT inhaler Inhale 2 puffs into the lungs every 4 (four) hours  as needed.        . ALPRAZolam (XANAX) 0.5 MG tablet Take 1 tablet (0.5 mg total) by mouth at bedtime as needed. Take 1-2  60 tablet  2  . aspirin EC 81 MG tablet Take 1 tablet (81 mg total) by mouth daily.  150 tablet  2  . brinzolamide (AZOPT) 1 % ophthalmic suspension Place 1 drop into both eyes 2 (two) times daily.        Marland Kitchen levothyroxine (SYNTHROID, LEVOTHROID) 100 MCG tablet Take 1 tablet (100 mcg total) by mouth daily.  30 tablet  3  . lisinopril (ZESTRIL) 2.5 MG tablet Take 1 tablet (2.5 mg total) by mouth daily.  30 tablet  3  . ranitidine (ZANTAC) 300 MG tablet TAKE 1 TABLET BY MOUTH AT BEDTIME  30 tablet  1  . tiotropium (SPIRIVA) 18 MCG inhalation capsule Place 18 mcg into inhaler and inhale daily.        . travoprost, benzalkonium, (TRAVATAN) 0.004 % ophthalmic solution Place 1 drop into both eyes at bedtime.        . triamcinolone (KENALOG) 0.1 % cream Apply 1 application topically 2 (two) times daily.          No Known Allergies  Review of Systems  Review of Systems  Constitutional: Negative  for fever and malaise/fatigue.  HENT: Negative for congestion.   Eyes: Negative for discharge.  Respiratory: Negative for shortness of breath.   Cardiovascular: Negative for chest pain, palpitations and leg swelling.  Gastrointestinal: Negative for nausea, abdominal pain and diarrhea.  Genitourinary: Negative for dysuria.  Musculoskeletal: Negative for falls.  Skin: Negative for rash.  Neurological: Negative for loss of consciousness and headaches.  Endo/Heme/Allergies: Negative for polydipsia.  Psychiatric/Behavioral: Negative for depression and suicidal ideas. The patient is not nervous/anxious and does not have insomnia.      Objective  BP 129/64  Pulse 80  Temp(Src) 98 F (36.7 C) (Oral)  Ht 5\' 8"  (1.727 m)  Wt 101 lb 12.8 oz (46.176 kg)  BMI 15.48 kg/m2  SpO2 93%  Physical Exam  Physical Exam  Constitutional: She is oriented to person, place, and time and well-developed, well-nourished, and in no distress. No distress.  HENT:  Head: Normocephalic and atraumatic.  Eyes: Conjunctivae are normal.  Neck: Neck supple. No thyromegaly present.  Cardiovascular: Normal rate, regular rhythm and normal heart sounds.   No murmur heard. Pulmonary/Chest: Effort normal and breath sounds normal. She has no wheezes.  Abdominal: She exhibits no distension and no mass.  Musculoskeletal: She exhibits no edema.  Lymphadenopathy:    She has no cervical adenopathy.  Neurological: She is alert and oriented to person, place, and time.  Skin: Skin is warm and dry. No rash noted. She is not diaphoretic.  Psychiatric: Memory, affect and judgment normal.    Lab Results  Component Value Date   TSH 0.43 07/19/2011   Lab Results  Component Value Date   WBC 9.2 07/19/2011   HGB 16.2* 07/19/2011   HCT 48.9* 07/19/2011   MCV 98.4 07/19/2011   PLT 191.0 07/19/2011   Lab Results  Component Value Date   CREATININE 0.8 07/19/2011   BUN 10 07/19/2011   NA 134* 07/19/2011   K 4.6 07/19/2011   CL 98  07/19/2011   CO2 27 07/19/2011   Lab Results  Component Value Date   ALT 13 07/19/2011   AST 18 07/19/2011   ALKPHOS 93 07/19/2011   BILITOT 0.7 07/19/2011   Lab Results  Component Value Date   CHOL 203* 07/19/2011   Lab Results  Component Value Date   HDL 70.90 07/19/2011   No results found for this basename: [facility]   Lab Results  Component Value Date   TRIG 97.0 07/19/2011   Lab Results  Component Value Date   CHOLHDL 3 07/19/2011     Assessment & Plan  Weight loss Reports she is eating better, has gained slightly  Tobacco abuse disorder Unfortunately she continues to smoke, encouraged cessation again, patient noncommittal due to increased stressor with daughter  Thyroid disease Repeat TSH today   Hyperlipidemia Encouraged avoid trans fats and  Repeat a lipid panel today  Diabetes mellitus Last hgba1c well controlled, will repeat today  Anxiety Given a refill on her Alprazolam she is headed to [place] this week to care for her daughter, will consider an SSRI when she returns after first of the year but if she has increased stress while she is gone, she will call

## 2011-11-05 NOTE — Patient Instructions (Signed)
Nicotine Addiction Nicotine can act as both a stimulant (excites/activates) and a sedative (calms/quiets). Immediately after exposure to nicotine, there is a "kick" caused in part by the drug's stimulation of the adrenal glands and resulting discharge of adrenaline (epinephrine). The rush of adrenaline stimulates the body and causes a sudden release of sugar. This means that smokers are always slightly hyperglycemic. Hyperglycemic means that the blood sugar is high, just like in diabetics. Nicotine also decreases the amount of insulin which helps control sugar levels in the body. There is an increase in blood pressure, breathing, and the rate of heart beats.  In addition, nicotine indirectly causes a release of dopamine in the brain that controls pleasure and motivation. A similar reaction is seen with other drugs of abuse, such as cocaine and heroin. This dopamine release is thought to cause the pleasurable sensations when smoking. In some different cases, nicotine can also create a calming effect, depending on sensitivity of the smoker's nervous system and the dose of nicotine taken. WHAT HAPPENS WHEN NICOTINE IS TAKEN FOR LONG PERIODS OF TIME?  Long-term use of nicotine results in addiction. It is difficult to stop.   Repeated use of nicotine creates tolerance. Higher doses of nicotine are needed to get the "kick."  When nicotine use is stopped, withdrawal may last a month or more. Withdrawal may begin within a few hours after the last cigarette. Symptoms peak within the first few days and may lessen within a few weeks. For some people, however, symptoms may last for months or longer. Withdrawal symptoms include:   Irritability.   Craving.   Learning and attention deficits.   Sleep disturbances.   Increased appetite.  Craving for tobacco may last for 6 months or longer. Many behaviors done while using nicotine can also play a part in the severity of withdrawal symptoms. For some people, the  feel, smell, and sight of a cigarette and the ritual of obtaining, handling, lighting, and smoking the cigarette are closely linked with the pleasure of smoking. When stopped, they also miss the related behaviors which make the withdrawal or craving worse. While nicotine gum and patches may lessen the drug aspects of withdrawal, cravings often persist. WHAT ARE THE MEDICAL CONSEQUENCES OF NICOTINE USE?  Nicotine addiction accounts for one-third of all cancers. The top cancer caused by tobacco is lung cancer. Lung cancer is the number one cancer killer of both men and women.   Smoking is also associated with cancers of the:   Mouth.   Pharynx.   Larynx.   Esophagus.   Stomach.   Pancreas.   Cervix.   Kidney.   Ureter.   Bladder.   Smoking also causes lung diseases such as lasting (chronic) bronchitis and emphysema.   It worsens asthma in adults and children.   Smoking increases the risk of heart disease, including:   Stroke.   Heart attack.   Vascular disease.   Aneurysm.   Passive or secondary smoke can also increase medical risks including:   Asthma in children.   Sudden Infant Death Syndrome (SIDS).   Additionally, dropped cigarettes are the leading cause of residential fire fatalities.   Nicotine poisoning has been reported from accidental ingestion of tobacco products by children and pets. Death usually results in a few minutes from respiratory failure (when a person stops breathing) caused by paralysis.  TREATMENT   Medication. Nicotine replacement medicines such as nicotine gum and the patch are used to stop smoking. These medicines gradually lower the dosage   of nicotine in the body. These medicines do not contain the carbon monoxide and other toxins found in tobacco smoke.   Hypnotherapy.   Relaxation therapy.   Nicotine Anonymous (a 12-step support program). Find times and locations in your local yellow pages.  Document Released: 08/15/2004 Document  Revised: 08/22/2011 Document Reviewed: 01/07/2008 [employer] Patient Information 2012 [employer]. 

## 2011-11-05 NOTE — Assessment & Plan Note (Signed)
Unfortunately she continues to smoke, encouraged cessation again, patient noncommittal due to increased stressor with daughter

## 2011-11-05 NOTE — Assessment & Plan Note (Signed)
Given a refill on her Alprazolam she is headed to [place] this week to care for her daughter, will consider an SSRI when she returns after first of the year but if she has increased stress while she is gone, she will call

## 2011-11-06 LAB — HEPATIC FUNCTION PANEL
AST: 19 U/L (ref 0–37)
Alkaline Phosphatase: 88 U/L (ref 39–117)
Bilirubin, Direct: 0.1 mg/dL (ref 0.0–0.3)
Total Protein: 7 g/dL (ref 6.0–8.3)

## 2011-11-06 LAB — LIPID PANEL
Cholesterol: 197 mg/dL (ref 0–200)
LDL Cholesterol: 116 mg/dL — ABNORMAL HIGH (ref 0–99)
Total CHOL/HDL Ratio: 3
Triglycerides: 72 mg/dL (ref 0.0–149.0)
VLDL: 14.4 mg/dL (ref 0.0–40.0)

## 2011-11-06 LAB — RENAL FUNCTION PANEL
Albumin: 4.2 g/dL (ref 3.5–5.2)
CO2: 29 mEq/L (ref 19–32)
Chloride: 102 mEq/L (ref 96–112)
Phosphorus: 3.9 mg/dL (ref 2.3–4.6)
Potassium: 5.1 mEq/L (ref 3.5–5.1)

## 2011-11-07 MED ORDER — CYCLOBENZAPRINE HCL 10 MG PO TABS: 10.0000 mg | ORAL_TABLET | Freq: Three times a day (TID) | ORAL | Status: DC | PRN

## 2011-11-07 NOTE — Progress Notes (Signed)
Please advise Flexeril 10 mg? Pt would like 90 sent into [facility]? Pt states the directions are 3 daily prn?

## 2011-11-11 NOTE — Assessment & Plan Note (Signed)
No exacerbations recently

## 2011-12-04 ENCOUNTER — Other Ambulatory Visit: Payer: Self-pay

## 2011-12-04 DIAGNOSIS — G4700 Insomnia, unspecified: Secondary | ICD-10-CM

## 2011-12-04 DIAGNOSIS — F419 Anxiety disorder, unspecified: Secondary | ICD-10-CM

## 2011-12-04 MED ORDER — ALPRAZOLAM 0.5 MG PO TABS: 0.5000 mg | ORAL_TABLET | Freq: Every evening | ORAL | Status: DC | PRN

## 2011-12-04 NOTE — Telephone Encounter (Signed)
Please advise refill? 

## 2011-12-06 ENCOUNTER — Other Ambulatory Visit: Payer: Self-pay | Admitting: Family Medicine

## 2011-12-06 NOTE — Telephone Encounter (Signed)
[facility] [phone] for Advair, alpralozam, ventolin inhaler

## 2011-12-07 MED ORDER — FLUTICASONE-SALMETEROL 250-50 MCG/DOSE IN AEPB: 1.0000 | INHALATION_SPRAY | Freq: Two times a day (BID) | RESPIRATORY_TRACT | Status: AC

## 2011-12-07 MED ORDER — ALBUTEROL SULFATE HFA 108 (90 BASE) MCG/ACT IN AERS: 2.0000 | INHALATION_SPRAY | RESPIRATORY_TRACT | Status: DC | PRN

## 2011-12-07 NOTE — Telephone Encounter (Signed)
PC to pt.  She is using advair on a prn basis and hers is expired.  Refill sent for Advair and Ventolin.  Advised pt we faxed alprazolam RX to [facility] and [facility] [place] [facility].  She is agreeable.

## 2012-01-08 ENCOUNTER — Other Ambulatory Visit: Payer: Self-pay | Admitting: Family Medicine

## 2012-02-05 ENCOUNTER — Other Ambulatory Visit: Payer: Self-pay

## 2012-02-05 MED ORDER — LISINOPRIL 2.5 MG PO TABS: 2.5000 mg | ORAL_TABLET | Freq: Every day | ORAL | Status: DC

## 2012-02-06 ENCOUNTER — Other Ambulatory Visit: Payer: Self-pay | Admitting: Family Medicine

## 2012-02-11 ENCOUNTER — Other Ambulatory Visit: Payer: Self-pay | Admitting: *Deleted

## 2012-02-11 MED ORDER — CYCLOBENZAPRINE HCL 10 MG PO TABS: 10.0000 mg | ORAL_TABLET | Freq: Three times a day (TID) | ORAL | Status: DC | PRN

## 2012-02-11 NOTE — Telephone Encounter (Signed)
Last filled Nov, 2012.  Last seen 11/05/11, follow up on 03/05/11 with labs one week prior.   OK to fill?

## 2012-02-26 ENCOUNTER — Other Ambulatory Visit (INDEPENDENT_AMBULATORY_CARE_PROVIDER_SITE_OTHER): Payer: Medicare Other

## 2012-02-26 DIAGNOSIS — I10 Essential (primary) hypertension: Secondary | ICD-10-CM

## 2012-02-26 DIAGNOSIS — E039 Hypothyroidism, unspecified: Secondary | ICD-10-CM

## 2012-02-26 DIAGNOSIS — E119 Type 2 diabetes mellitus without complications: Secondary | ICD-10-CM

## 2012-02-26 DIAGNOSIS — E785 Hyperlipidemia, unspecified: Secondary | ICD-10-CM

## 2012-02-26 LAB — CBC
HCT: 49.6 % — ABNORMAL HIGH (ref 36.0–46.0)
Hemoglobin: 16.3 g/dL — ABNORMAL HIGH (ref 12.0–15.0)
MCHC: 32.9 g/dL (ref 30.0–36.0)
MCV: 99.2 fl (ref 78.0–100.0)
Platelets: 199 K/uL (ref 150.0–400.0)
RBC: 5 Mil/uL (ref 3.87–5.11)
RDW: 13.3 % (ref 11.5–14.6)
WBC: 6.9 K/uL (ref 4.5–10.5)

## 2012-02-26 LAB — RENAL FUNCTION PANEL
Albumin: 4.3 g/dL (ref 3.5–5.2)
BUN: 15 mg/dL (ref 6–23)
CO2: 29 meq/L (ref 19–32)
Calcium: 9.4 mg/dL (ref 8.4–10.5)
Chloride: 95 meq/L — ABNORMAL LOW (ref 96–112)
Creatinine, Ser: 0.9 mg/dL (ref 0.4–1.2)
GFR: 66.15 mL/min
Glucose, Bld: 104 mg/dL — ABNORMAL HIGH (ref 70–99)
Phosphorus: 3.7 mg/dL (ref 2.3–4.6)
Potassium: 5.3 meq/L — ABNORMAL HIGH (ref 3.5–5.1)
Sodium: 135 meq/L (ref 135–145)

## 2012-02-26 LAB — TSH: TSH: 1.29 u[IU]/mL (ref 0.35–5.50)

## 2012-02-26 LAB — HEPATIC FUNCTION PANEL
ALT: 12 U/L (ref 0–35)
Total Protein: 7.4 g/dL (ref 6.0–8.3)

## 2012-02-26 LAB — HEMOGLOBIN A1C: Hgb A1c MFr Bld: 6 % (ref 4.6–6.5)

## 2012-02-26 LAB — LIPID PANEL
Cholesterol: 224 mg/dL — ABNORMAL HIGH (ref 0–200)
Triglycerides: 85 mg/dL (ref 0.0–149.0)

## 2012-02-26 LAB — LDL CHOLESTEROL, DIRECT: Direct LDL: 138.9 mg/dL

## 2012-03-03 ENCOUNTER — Telehealth: Payer: Self-pay

## 2012-03-03 MED ORDER — TIOTROPIUM BROMIDE MONOHYDRATE 18 MCG IN CAPS: 18.0000 ug | ORAL_CAPSULE | Freq: Every day | RESPIRATORY_TRACT | Status: DC

## 2012-03-03 NOTE — Telephone Encounter (Signed)
RX sent to pharmacy  

## 2012-03-04 ENCOUNTER — Ambulatory Visit (INDEPENDENT_AMBULATORY_CARE_PROVIDER_SITE_OTHER): Payer: Medicare Other | Admitting: Family Medicine

## 2012-03-04 ENCOUNTER — Encounter: Payer: Self-pay | Admitting: Family Medicine

## 2012-03-04 DIAGNOSIS — E119 Type 2 diabetes mellitus without complications: Secondary | ICD-10-CM

## 2012-03-04 DIAGNOSIS — I10 Essential (primary) hypertension: Secondary | ICD-10-CM

## 2012-03-04 DIAGNOSIS — R030 Elevated blood-pressure reading, without diagnosis of hypertension: Secondary | ICD-10-CM

## 2012-03-04 DIAGNOSIS — G4700 Insomnia, unspecified: Secondary | ICD-10-CM

## 2012-03-04 DIAGNOSIS — E875 Hyperkalemia: Secondary | ICD-10-CM

## 2012-03-04 DIAGNOSIS — K219 Gastro-esophageal reflux disease without esophagitis: Secondary | ICD-10-CM

## 2012-03-04 DIAGNOSIS — R634 Abnormal weight loss: Secondary | ICD-10-CM

## 2012-03-04 DIAGNOSIS — IMO0001 Reserved for inherently not codable concepts without codable children: Secondary | ICD-10-CM

## 2012-03-04 DIAGNOSIS — F411 Generalized anxiety disorder: Secondary | ICD-10-CM

## 2012-03-04 DIAGNOSIS — E079 Disorder of thyroid, unspecified: Secondary | ICD-10-CM

## 2012-03-04 DIAGNOSIS — J309 Allergic rhinitis, unspecified: Secondary | ICD-10-CM

## 2012-03-04 DIAGNOSIS — J300 Vasomotor rhinitis: Secondary | ICD-10-CM

## 2012-03-04 DIAGNOSIS — J4489 Other specified chronic obstructive pulmonary disease: Secondary | ICD-10-CM

## 2012-03-04 DIAGNOSIS — J449 Chronic obstructive pulmonary disease, unspecified: Secondary | ICD-10-CM

## 2012-03-04 DIAGNOSIS — F419 Anxiety disorder, unspecified: Secondary | ICD-10-CM

## 2012-03-04 HISTORY — DX: Essential (primary) hypertension: I10

## 2012-03-04 HISTORY — DX: Reserved for inherently not codable concepts without codable children: IMO0001

## 2012-03-04 LAB — RENAL FUNCTION PANEL
Albumin: 4.4 g/dL (ref 3.5–5.2)
BUN: 10 mg/dL (ref 6–23)
Chloride: 95 mEq/L — ABNORMAL LOW (ref 96–112)
Creatinine, Ser: 0.9 mg/dL (ref 0.4–1.2)
Phosphorus: 3.6 mg/dL (ref 2.3–4.6)

## 2012-03-04 MED ORDER — AZELASTINE HCL 0.1 % NA SOLN: 1.0000 | Freq: Two times a day (BID) | NASAL | Status: AC

## 2012-03-04 MED ORDER — ALBUTEROL SULFATE HFA 108 (90 BASE) MCG/ACT IN AERS: 2.0000 | INHALATION_SPRAY | Freq: Four times a day (QID) | RESPIRATORY_TRACT | Status: DC | PRN

## 2012-03-04 MED ORDER — RANITIDINE HCL 300 MG PO TABS: 300.0000 mg | ORAL_TABLET | Freq: Every day | ORAL | Status: DC

## 2012-03-04 MED ORDER — ALPRAZOLAM 0.5 MG PO TABS: 0.5000 mg | ORAL_TABLET | Freq: Every evening | ORAL | Status: DC | PRN

## 2012-03-04 NOTE — Assessment & Plan Note (Signed)
No recent exacerbation 

## 2012-03-04 NOTE — Assessment & Plan Note (Signed)
Well controlled, encouraged to continue to minimize simple carbs

## 2012-03-04 NOTE — Assessment & Plan Note (Signed)
Mild on repeat check. Would consider a low dose of lisinopril in the future but patient hesitant to start new meds today

## 2012-03-04 NOTE — Assessment & Plan Note (Signed)
Weight has been stable and appetite has increased some.

## 2012-03-04 NOTE — Patient Instructions (Signed)
Hypercholesterolemia High Blood Cholesterol Cholesterol is a white, waxy, fat-like protein needed by your body in small amounts. The liver makes all the cholesterol you need. It is carried from the liver by the blood through the blood vessels. Deposits (plaque) may build up on blood vessel walls. This makes the arteries narrower and stiffer. Plaque increases the risk for heart attack and stroke. You cannot feel your cholesterol level even if it is very high. The only way to know is by a blood test to check your lipid (fats) levels. Once you know your cholesterol levels, you should keep a record of the test results. Work with your caregiver to to keep your levels in the desired range. WHAT THE RESULTS MEAN:  Total cholesterol is a rough measure of all the cholesterol in your blood.   LDL is the so-called bad cholesterol. This is the type that deposits cholesterol in the walls of the arteries. You want this level to be low.   HDL is the good cholesterol because it cleans the arteries and carries the LDL away. You want this level to be high.   Triglycerides are fat that the body can either burn for energy or store. High levels are closely linked to heart disease.  DESIRED LEVELS:  Total cholesterol below 200.   LDL below 100 for people at risk, below 70 for very high risk.   HDL above 50 is good, above 60 is best.   Triglycerides below 150.  HOW TO LOWER YOUR CHOLESTEROL:  Diet.   Choose fish or white meat chicken and [place], roasted or baked. Limit fatty cuts of red meat, fried foods, and processed meats, such as sausage and lunch meat.   Eat lots of fresh fruits and vegetables. Choose whole grains, beans, pasta, potatoes and cereals.   Use only small amounts of olive, corn or canola oils. Avoid butter, mayonnaise, shortening or palm kernel oils. Avoid foods with trans-fats.   Use skim/nonfat milk and low-fat/nonfat yogurt and cheeses. Avoid whole milk, cream, ice cream, egg yolks and  cheeses. Healthy desserts include angel food cake, gingersnaps, animal crackers, hard candy, popsicles, and low-fat/nonfat frozen yogurt. Avoid pastries, cakes, pies and cookies.   Exercise.   A regular program helps decrease LDL and raises HDL.   Helps with weight control.   Do things that increase your activity level like gardening, walking, or taking the stairs.   Medication.   May be prescribed by your caregiver to help lowering cholesterol and the risk for heart disease.   You may need medicine even if your levels are normal if you have several risk factors.  HOME CARE INSTRUCTIONS   Follow your diet and exercise programs as suggested by your caregiver.   Take medications as directed.   Have blood work done when your caregiver feels it is necessary.  MAKE SURE YOU:   Understand these instructions.   Will watch your condition.   Will get help right away if you are not doing well or get worse.  Document Released: 12/10/2005 Document Revised: 11/29/2011 Document Reviewed: 05/28/2007 [facility] Patient Information 2012 [place], [place].  Freeze and restasrt fish oil caps daily or try a Krill oil cap like MegAred caps 1 daily by [employer]

## 2012-03-04 NOTE — Assessment & Plan Note (Signed)
Stable on current dose of Levothyroxine. No changes 

## 2012-03-04 NOTE — Progress Notes (Signed)
Patient ID: Barbara Phelps, female   DOB: 10-24-1932, 78 y.o.   MRN: [id number] Barbara Phelps [id number] 1932/03/29 03/04/2012      Progress Note-Follow Up  Subjective  Chief Complaint  Chief Complaint  Patient presents with  . Follow-up    4 month follow up    HPI  Is a 78 year old Caucasian female in today for follow up. She feels well. She continues with low back pain and some weakness but overall this is unchanged. Indication mild headache but nothing debilitating or any associated symptoms. No visual changes or hearing changes no other new neurologic complaints. No recent illness, chest pain, palpitations, shortness of breath, GI or GU complaints, overall she feels her breathing is greatly improved and she offers no acute complaints. Appetite is somewhat better. No c/o dyspepsia. Has not been taking many pain meds must some occasional Tylenol which helps very little  Past Medical History  Diagnosis Date  . Cigarette smoker   . Chicken pox as a child  . Measles as a child  . Mumps as a child  . Anxiety     panic attacks  . Cataract   . COPD (chronic obstructive pulmonary disease)   . Emphysema of lung   . Glaucoma   . Thyroid disease   . Peripheral vascular disease   . Osteoporosis   . Neuromuscular disorder   . Guillain-Barre syndrome     1995, 2 1/2 months in hospital  . Tobacco abuse disorder   . Pneumonia     June 2012, left side  . Reflux 07/04/2011  . Diabetes mellitus 07/06/2011  . Hyperlipidemia 07/19/2011  . Weight loss 08/21/2011  . Elevated BP 03/04/2012    Past Surgical History  Procedure Date  . Cataract extraction, bilateral     Family History  Problem Relation Age of Onset  . Cancer Mother     lung/ nonsmoker  . Heart attack Father   . Hypertension Father   . Hypertension Sister   . Hyperlipidemia Sister   . Arthritis Sister   . Hypertension Brother   . COPD Brother     smoker  . Emphysema Brother   . Diabetes Paternal Grandfather   .  Diabetes Sister     type 2  . Heart disease Sister   . Hypertension Sister   . Hyperlipidemia Sister   . Lupus Sister   . Kidney disease Sister   . Cancer Brother 39    brain   . Cancer Brother 2    melonoma  . Hypertension Brother   . Heart disease Brother   . Cancer Brother 72    lung/ smoker  . Cancer Brother 81    pancreatic/ smoker  . Other Daughter     spinal bifida  . Fibromyalgia Daughter   . Cancer Son 14    liver    History   Social History  . Marital Status: Widowed    Spouse Name: N/A    Number of Children: N/A  . Years of Education: N/A   Occupational History  . Not on file.   Social History Main Topics  . Smoking status: Current Everyday Smoker -- 0.5 packs/day for 60 years    Types: Cigarettes  . Smokeless tobacco: Never Used  . Alcohol Use: No  . Drug Use: No  . Sexually Active: No   Other Topics Concern  . Not on file   Social History Narrative  . No narrative on file    Current  Outpatient Prescriptions on File Prior to Visit  Medication Sig Dispense Refill  . albuterol (PROVENTIL) (2.5 MG/3ML) 0.083% nebulizer solution Take 2.5 mg by nebulization 2 (two) times daily as needed.  75 mL  5  . aspirin EC 81 MG tablet Take 1 tablet (81 mg total) by mouth daily.  150 tablet  2  . brinzolamide (AZOPT) 1 % ophthalmic suspension Place 1 drop into both eyes 2 (two) times daily.        . cyclobenzaprine (FLEXERIL) 10 MG tablet Take 1 tablet (10 mg total) by mouth 3 (three) times daily as needed.  90 tablet  1  . Fluticasone-Salmeterol (ADVAIR DISKUS) 250-50 MCG/DOSE AEPB Inhale 1 puff into the lungs 2 (two) times daily.  60 each  1  . levothyroxine (SYNTHROID, LEVOTHROID) 100 MCG tablet Take 1 tablet (100 mcg total) by mouth daily.  30 tablet  3  . lisinopril (PRINIVIL,ZESTRIL) 2.5 MG tablet Take 1 tablet (2.5 mg total) by mouth daily.  30 tablet  3  . mupirocin (BACTROBAN) 2 % ointment Apply to affected area 3 times daily  22 g  1  . tiotropium  (SPIRIVA) 18 MCG inhalation capsule Place 1 capsule (18 mcg total) into inhaler and inhale daily.  30 capsule  1  . travoprost, benzalkonium, (TRAVATAN) 0.004 % ophthalmic solution Place 1 drop into both eyes at bedtime.        . triamcinolone (KENALOG) 0.1 % cream Apply 1 application topically 2 (two) times daily.        Marland Kitchen DISCONTD: albuterol (VENTOLIN HFA) 108 (90 BASE) MCG/ACT inhaler Inhale 2 puffs into the lungs every 4 (four) hours as needed.  18 g  2    No Known Allergies  Review of Systems  Review of Systems  Constitutional: Negative for fever and malaise/fatigue.  HENT: Negative for congestion.   Eyes: Negative for discharge.  Respiratory: Negative for shortness of breath.   Cardiovascular: Negative for chest pain, palpitations and leg swelling.  Gastrointestinal: Negative for nausea, abdominal pain and diarrhea.  Genitourinary: Negative for dysuria.  Musculoskeletal: Positive for back pain. Negative for falls.  Skin: Negative for rash.  Neurological: Negative for loss of consciousness and headaches.  Endo/Heme/Allergies: Negative for polydipsia.  Psychiatric/Behavioral: Negative for depression and suicidal ideas. The patient is not nervous/anxious and does not have insomnia.     Objective  BP 140/74  Pulse 86  Temp(Src) 97.6 F (36.4 C) (Temporal)  Ht 5\' 8"  (1.727 m)  Wt 101 lb 1.9 oz (45.868 kg)  BMI 15.38 kg/m2  SpO2 96%  Physical Exam  Physical Exam  Constitutional: She is oriented to person, place, and time and well-developed, well-nourished, and in no distress. No distress.       thin  HENT:  Head: Normocephalic and atraumatic.  Eyes: Conjunctivae are normal.  Neck: Neck supple. No thyromegaly present.  Cardiovascular: Normal rate, regular rhythm and normal heart sounds.   No murmur heard. Pulmonary/Chest: Effort normal and breath sounds normal. She has no wheezes.  Abdominal: She exhibits no distension and no mass.  Musculoskeletal: She exhibits no  edema.  Lymphadenopathy:    She has no cervical adenopathy.  Neurological: She is alert and oriented to person, place, and time.  Skin: Skin is warm and dry. No rash noted. She is not diaphoretic.  Psychiatric: Memory, affect and judgment normal.    Lab Results  Component Value Date   TSH 1.29 02/26/2012   Lab Results  Component Value Date   WBC  6.9 02/26/2012   HGB 16.3* 02/26/2012   HCT 49.6* 02/26/2012   MCV 99.2 02/26/2012   PLT 199.0 02/26/2012   Lab Results  Component Value Date   CREATININE 0.9 03/04/2012   BUN 10 03/04/2012   NA 133* 03/04/2012   K 5.1 03/04/2012   CL 95* 03/04/2012   CO2 29 03/04/2012   Lab Results  Component Value Date   ALT 12 02/26/2012   AST 16 02/26/2012   ALKPHOS 91 02/26/2012   BILITOT 0.4 02/26/2012   Lab Results  Component Value Date   CHOL 224* 02/26/2012   Lab Results  Component Value Date   HDL 68.50 02/26/2012   Lab Results  Component Value Date   LDLCALC 116* 11/05/2011   Lab Results  Component Value Date   TRIG 85.0 02/26/2012   Lab Results  Component Value Date   CHOLHDL 3 02/26/2012     Assessment & Plan  Weight loss Weight has been stable and appetite has increased some.  Diabetes mellitus Well controlled, encouraged to continue to minimize simple carbs  Thyroid disease Stable on current dose of Levothyroxine. No changes  COPD (chronic obstructive pulmonary disease) No recent exacerbation.  Elevated BP Mild on repeat check. Would consider a low dose of lisinopril in the future but patient hesitant to start new meds today

## 2012-04-02 ENCOUNTER — Other Ambulatory Visit: Payer: Self-pay

## 2012-04-02 MED ORDER — CYCLOBENZAPRINE HCL 10 MG PO TABS: 10.0000 mg | ORAL_TABLET | Freq: Three times a day (TID) | ORAL | Status: DC | PRN

## 2012-04-02 NOTE — Telephone Encounter (Signed)
Please advise refill? Last RX wrote on 02-11-12 quantity #90 with 1 refill

## 2012-05-07 ENCOUNTER — Other Ambulatory Visit: Payer: Self-pay

## 2012-05-07 MED ORDER — TIOTROPIUM BROMIDE MONOHYDRATE 18 MCG IN CAPS: 18.0000 ug | ORAL_CAPSULE | Freq: Every day | RESPIRATORY_TRACT | Status: DC

## 2012-06-03 ENCOUNTER — Other Ambulatory Visit: Payer: Self-pay

## 2012-06-03 MED ORDER — CYCLOBENZAPRINE HCL 10 MG PO TABS: 10.0000 mg | ORAL_TABLET | Freq: Three times a day (TID) | ORAL | Status: DC | PRN

## 2012-06-04 ENCOUNTER — Other Ambulatory Visit: Payer: Self-pay

## 2012-06-04 MED ORDER — LISINOPRIL 2.5 MG PO TABS: 2.5000 mg | ORAL_TABLET | Freq: Every day | ORAL | Status: DC

## 2012-07-03 ENCOUNTER — Other Ambulatory Visit: Payer: Self-pay | Admitting: *Deleted

## 2012-07-03 NOTE — Telephone Encounter (Signed)
Faxed refill request received from pharmacy for CYCLOBENZAPRINE (auto-fill from pharmacy) Last filled by MD on 06/03/12, #[phone] X [phone] Last seen on 03/04/12 Follow up needed in 4 months.  No appt in computer. PC to patient home number to verify if patient really needs refill.  Unable to leave message as voicemail has not been set up. Please advise refill.

## 2012-07-03 NOTE — Telephone Encounter (Signed)
Go ahead and refill this with same sig and same number if they want it but let them know they would have to come in for a follow up before we can refill again

## 2012-07-25 ENCOUNTER — Other Ambulatory Visit: Payer: Self-pay

## 2012-07-25 DIAGNOSIS — G4700 Insomnia, unspecified: Secondary | ICD-10-CM

## 2012-07-25 DIAGNOSIS — F419 Anxiety disorder, unspecified: Secondary | ICD-10-CM

## 2012-07-25 MED ORDER — LEVOTHYROXINE SODIUM 100 MCG PO TABS: 100.0000 ug | ORAL_TABLET | Freq: Every day | ORAL | Status: AC

## 2012-07-25 MED ORDER — ALPRAZOLAM 0.5 MG PO TABS: 0.5000 mg | ORAL_TABLET | Freq: Every evening | ORAL | Status: DC | PRN

## 2012-07-25 MED ORDER — LISINOPRIL 2.5 MG PO TABS: 2.5000 mg | ORAL_TABLET | Freq: Every day | ORAL | Status: DC

## 2012-07-25 MED ORDER — TIOTROPIUM BROMIDE MONOHYDRATE 18 MCG IN CAPS: 18.0000 ug | ORAL_CAPSULE | Freq: Every day | RESPIRATORY_TRACT | Status: DC

## 2012-07-30 NOTE — Telephone Encounter (Signed)
RX not needed

## 2012-08-28 ENCOUNTER — Other Ambulatory Visit: Payer: Self-pay

## 2012-08-28 DIAGNOSIS — J449 Chronic obstructive pulmonary disease, unspecified: Secondary | ICD-10-CM

## 2012-08-28 MED ORDER — ALBUTEROL SULFATE HFA 108 (90 BASE) MCG/ACT IN AERS: 2.0000 | INHALATION_SPRAY | Freq: Four times a day (QID) | RESPIRATORY_TRACT | Status: DC | PRN

## 2012-08-28 MED ORDER — RANITIDINE HCL 300 MG PO TABS: 300.0000 mg | ORAL_TABLET | Freq: Every day | ORAL | Status: DC

## 2012-09-26 ENCOUNTER — Ambulatory Visit (INDEPENDENT_AMBULATORY_CARE_PROVIDER_SITE_OTHER): Payer: Medicare Other | Admitting: Family Medicine

## 2012-09-26 ENCOUNTER — Ambulatory Visit (INDEPENDENT_AMBULATORY_CARE_PROVIDER_SITE_OTHER)
Admission: RE | Admit: 2012-09-26 | Discharge: 2012-09-26 | Disposition: A | Discharge status: Home / Self Care | Payer: Medicare Other | Source: Ambulatory Visit | Attending: Family Medicine | Admitting: Family Medicine

## 2012-09-26 ENCOUNTER — Encounter: Payer: Self-pay | Admitting: Family Medicine

## 2012-09-26 VITALS — BP 148/80 | HR 87 | Temp 97.1°F | Ht 68.0 in | Wt 96.8 lb

## 2012-09-26 DIAGNOSIS — R634 Abnormal weight loss: Secondary | ICD-10-CM

## 2012-09-26 DIAGNOSIS — F411 Generalized anxiety disorder: Secondary | ICD-10-CM

## 2012-09-26 DIAGNOSIS — E079 Disorder of thyroid, unspecified: Secondary | ICD-10-CM

## 2012-09-26 DIAGNOSIS — I10 Essential (primary) hypertension: Secondary | ICD-10-CM

## 2012-09-26 DIAGNOSIS — G4700 Insomnia, unspecified: Secondary | ICD-10-CM

## 2012-09-26 DIAGNOSIS — R05 Cough: Secondary | ICD-10-CM

## 2012-09-26 DIAGNOSIS — F17200 Nicotine dependence, unspecified, uncomplicated: Secondary | ICD-10-CM

## 2012-09-26 DIAGNOSIS — E785 Hyperlipidemia, unspecified: Secondary | ICD-10-CM

## 2012-09-26 DIAGNOSIS — Z720 Tobacco use: Secondary | ICD-10-CM

## 2012-09-26 DIAGNOSIS — E119 Type 2 diabetes mellitus without complications: Secondary | ICD-10-CM

## 2012-09-26 DIAGNOSIS — F419 Anxiety disorder, unspecified: Secondary | ICD-10-CM

## 2012-09-26 LAB — RENAL FUNCTION PANEL
Albumin: 4.1 g/dL (ref 3.5–5.2)
BUN: 10 mg/dL (ref 6–23)
Calcium: 9.4 mg/dL (ref 8.4–10.5)
Chloride: 99 mEq/L (ref 96–112)
Potassium: 5 mEq/L (ref 3.5–5.1)

## 2012-09-26 LAB — CBC
MCHC: 33 g/dL (ref 30.0–36.0)
MCV: 97.8 fl (ref 78.0–100.0)
RBC: 4.67 Mil/uL (ref 3.87–5.11)

## 2012-09-26 LAB — LDL CHOLESTEROL, DIRECT: Direct LDL: 111.5 mg/dL

## 2012-09-26 LAB — HEPATIC FUNCTION PANEL
ALT: 12 U/L (ref 0–35)
Alkaline Phosphatase: 97 U/L (ref 39–117)
Bilirubin, Direct: 0.2 mg/dL (ref 0.0–0.3)
Total Bilirubin: 0.9 mg/dL (ref 0.3–1.2)

## 2012-09-26 LAB — LIPID PANEL
Cholesterol: 202 mg/dL — ABNORMAL HIGH (ref 0–200)
Total CHOL/HDL Ratio: 3
Triglycerides: 65 mg/dL (ref 0.0–149.0)
VLDL: 13 mg/dL (ref 0.0–40.0)

## 2012-09-26 MED ORDER — ALPRAZOLAM 0.5 MG PO TABS: 0.5000 mg | ORAL_TABLET | Freq: Every evening | ORAL | Status: DC | PRN

## 2012-09-26 NOTE — Patient Instructions (Addendum)

## 2012-09-30 ENCOUNTER — Encounter: Payer: Self-pay | Admitting: Family Medicine

## 2012-09-30 NOTE — Assessment & Plan Note (Addendum)
Unfortunately she continues to loose weight. She reports she is eating but only small amounts [place] She is encouraged to try ensure tid and we will continue to monitor. TSH is normal today

## 2012-09-30 NOTE — Progress Notes (Signed)
Patient ID: Barbara Phelps, female   DOB: 02/26/1932, 78 y.o.   MRN: [id number] Barbara Phelps [id number] November 19, 1932 09/30/2012      Progress Note-Follow Up  Subjective  Chief Complaint  Chief Complaint  Patient presents with  . Follow-up    HPI  Patient is a 78 year old Caucasian female who is in today for followup. [place]. She's been under a lot of stress and acknowledges she's not been eating well but she does not offer any other acute complaints. She's not had any recent acute illness, fevers, tripped year, chest pain or palpitations, shortness of breath, GU complaints. She doesn't know if she continues to work in some intermittent nausea but no vomiting. Unfortunately she continues to smoke.  Has been using Alprazolam prn infrequently with good results Past Medical History  Diagnosis Date  . Cigarette smoker   . Chicken pox as a child  . Measles as a child  . Mumps as a child  . Anxiety     panic attacks  . Cataract   . COPD (chronic obstructive pulmonary disease)   . Emphysema of lung   . Glaucoma   . Thyroid disease   . Peripheral vascular disease   . Osteoporosis   . Neuromuscular disorder   . Guillain-Barre syndrome     1995, 2 1/2 months in hospital  . Tobacco abuse disorder   . Pneumonia     June 2012, left side  . Reflux 07/04/2011  . Diabetes mellitus 07/06/2011  . Hyperlipidemia 07/19/2011  . Weight loss 08/21/2011  . Elevated BP 03/04/2012  . HTN (hypertension) 03/04/2012    Past Surgical History  Procedure Date  . Cataract extraction, bilateral     Family History  Problem Relation Age of Onset  . Cancer Mother     lung/ nonsmoker  . Heart attack Father   . Hypertension Father   . Hypertension Sister   . Hyperlipidemia Sister   . Arthritis Sister   . Hypertension Brother   . COPD Brother     smoker  . Emphysema Brother   . Diabetes Paternal Grandfather   . Diabetes Sister      type 2  . Heart disease Sister   . Hypertension Sister   . Hyperlipidemia Sister   . Lupus Sister   . Kidney disease Sister   . Cancer Brother 39    brain   . Cancer Brother 73    melonoma  . Hypertension Brother   . Heart disease Brother   . Cancer Brother 56    lung/ smoker  . Cancer Brother 79    pancreatic/ smoker  . Other Daughter     spinal bifida  . Fibromyalgia Daughter   . Cancer Son 21    liver    History   Social History  . Marital Status: Widowed    Spouse Name: N/A    Number of Children: N/A  . Years of Education: N/A   Occupational History  . Not on file.   Social History Main Topics  . Smoking status: Current Every Day Smoker -- 0.5 packs/day for 60 years    Types: Cigarettes  . Smokeless tobacco: Never Used  . Alcohol Use: No  . Drug Use: No  . Sexually Active: No   Other Topics Concern  . Not on file   Social History Narrative  . No narrative on file    Current  Outpatient Prescriptions on File Prior to Visit  Medication Sig Dispense Refill  . albuterol (PROVENTIL HFA;VENTOLIN HFA) 108 (90 BASE) MCG/ACT inhaler Inhale 2 puffs into the lungs every 6 (six) hours as needed for wheezing.  1 Inhaler  2  . albuterol (PROVENTIL) (2.5 MG/3ML) 0.083% nebulizer solution Take 2.5 mg by nebulization 2 (two) times daily as needed.  75 mL  5  . azelastine (ASTELIN) 137 MCG/SPRAY nasal spray Place 1 spray into the nose 2 (two) times daily. Use in each nostril as directed  30 mL  12  . brinzolamide (AZOPT) 1 % ophthalmic suspension Place 1 drop into both eyes 2 (two) times daily.        . cyclobenzaprine (FLEXERIL) 10 MG tablet Take 1 tablet (10 mg total) by mouth 3 (three) times daily as needed.  90 tablet  0  . Fluticasone-Salmeterol (ADVAIR DISKUS) 250-50 MCG/DOSE AEPB Inhale 1 puff into the lungs 2 (two) times daily.  60 each  1  . levothyroxine (SYNTHROID, LEVOTHROID) 100 MCG tablet Take 1 tablet (100 mcg total) by mouth daily.  30 tablet  2  .  mupirocin (BACTROBAN) 2 % ointment Apply to affected area 3 times daily  22 g  1  . ranitidine (ZANTAC) 300 MG tablet Take 1 tablet (300 mg total) by mouth at bedtime.  30 tablet  2  . tiotropium (SPIRIVA) 18 MCG inhalation capsule Place 1 capsule (18 mcg total) into inhaler and inhale daily.  30 capsule  2  . travoprost, benzalkonium, (TRAVATAN) 0.004 % ophthalmic solution Place 1 drop into both eyes at bedtime.        . triamcinolone (KENALOG) 0.1 % cream Apply 1 application topically 2 (two) times daily.         No Known Allergies  Review of Systems Review of Systems  Constitutional: Negative for fever and malaise/fatigue.  HENT: Negative for congestion.   Eyes: Negative for discharge.  Respiratory: Negative for shortness of breath.   Cardiovascular: Negative for chest pain, palpitations and leg swelling.  Gastrointestinal: Negative for nausea, abdominal pain and diarrhea.  Genitourinary: Negative for dysuria.  Musculoskeletal: Negative for falls.  Skin: Positive for rash.       Diffuse sun damage  Neurological: Negative for loss of consciousness and headaches.  Endo/Heme/Allergies: Negative for polydipsia.  Psychiatric/Behavioral: Negative for depression and suicidal ideas. The patient is not nervous/anxious and does not have insomnia.     Objective  BP 148/80  Pulse 87  Temp 97.1 F (36.2 C) (Temporal)  Ht 5\' 8"  (1.727 m)  Wt 96 lb 12.8 oz (43.908 kg)  BMI 14.72 kg/m2  SpO2 98%  Physical Exam  Physical Exam  Constitutional: She is oriented to person, place, and time and well-developed, well-nourished, and in no distress. No distress.  HENT:  Head: Normocephalic and atraumatic.  Eyes: Conjunctivae normal are normal.  Neck: Neck supple. No thyromegaly present.  Cardiovascular: Normal rate, regular rhythm and normal heart sounds.   No murmur heard. Pulmonary/Chest: Effort normal and breath sounds normal. She has no wheezes.  Abdominal: She exhibits no distension and  no mass.  Musculoskeletal: She exhibits no edema.  Lymphadenopathy:    She has no cervical adenopathy.  Neurological: She is alert and oriented to person, place, and time.  Skin: Skin is warm and dry. No rash noted. She is not diaphoretic.  Psychiatric: Memory, affect and judgment normal.    Lab Results  Component Value Date   TSH 0.66 09/26/2012   Lab  Results  Component Value Date   WBC 8.4 09/26/2012   HGB 15.1* 09/26/2012   HCT 45.7 09/26/2012   MCV 97.8 09/26/2012   PLT 199.0 09/26/2012   Lab Results  Component Value Date   CREATININE 0.8 09/26/2012   BUN 10 09/26/2012   NA 136 09/26/2012   K 5.0 09/26/2012   CL 99 09/26/2012   CO2 28 09/26/2012   Lab Results  Component Value Date   ALT 12 09/26/2012   AST 12 09/26/2012   ALKPHOS 97 09/26/2012   BILITOT 0.9 09/26/2012   Lab Results  Component Value Date   CHOL 202* 09/26/2012   Lab Results  Component Value Date   HDL 73.40 09/26/2012   Lab Results  Component Value Date   LDLCALC 116* 11/05/2011   Lab Results  Component Value Date   TRIG 65.0 09/26/2012   Lab Results  Component Value Date   CHOLHDL 3 09/26/2012     Assessment & Plan  HTN (hypertension) Minimize sodium and start the blood pressure meds she still has not picked up since her last visit.  Tobacco abuse disorder Unfortunately continues to smoke, encouraged to stop but patient not willing to commit to quitting but may try attempt it   Weight loss Unfortunately she continues to loose weight. She reports she is eating but only small amounts has [place] She is encouraged to try ensure tid and we will continue to monitor. TSH is normal today  Thyroid disease ths wnl, continue Levothyroxine at current doses  Hyperlipidemia Very mild elevation of cholesterol, avoid trans fats and start MegaRed caps daily  Diabetes mellitus hgba1c 5.8, no changes to meds, minimize simple carbs.

## 2012-09-30 NOTE — Assessment & Plan Note (Addendum)
Unfortunately continues to smoke, encouraged to stop but patient not willing to commit to quitting but may try attempt it

## 2012-09-30 NOTE — Assessment & Plan Note (Signed)
ths wnl, continue Levothyroxine at current doses

## 2012-09-30 NOTE — Assessment & Plan Note (Signed)
hgba1c 5.8, no changes to meds, minimize simple carbs.

## 2012-09-30 NOTE — Assessment & Plan Note (Signed)
Minimize sodium and start the blood pressure meds she still has not picked up since her last visit.

## 2012-09-30 NOTE — Assessment & Plan Note (Signed)
Very mild elevation of cholesterol, avoid trans fats and start MegaRed caps daily

## 2012-10-13 ENCOUNTER — Telehealth: Payer: Self-pay | Admitting: Family Medicine

## 2012-10-13 MED ORDER — CYCLOBENZAPRINE HCL 10 MG PO TABS: 10.0000 mg | ORAL_TABLET | Freq: Three times a day (TID) | ORAL | Status: DC | PRN

## 2012-10-13 NOTE — Telephone Encounter (Signed)
Pt states the insurance wouldn't pay for her Transdermal. Pt would like her Flexeril refilled [facility] [place]. Pt states she is close to [facility] in [place].  I will send in the flexeril.

## 2012-10-13 NOTE — Telephone Encounter (Signed)
So her cxr did show some changes from the last CXR so we should investigate it due to her history of smoking. We could try and get it done closer to her if she is agreeable. Have her figure out where she would like to go to have a CT scan, what hospital and then we can call them and figure out how to get it ordered. She can wait if she needs to but I will worry that something could be going on

## 2012-10-13 NOTE — Telephone Encounter (Signed)
So she is OK with me ordering the CT there right?

## 2012-10-14 ENCOUNTER — Other Ambulatory Visit: Payer: Self-pay | Admitting: Family Medicine

## 2012-10-14 DIAGNOSIS — R05 Cough: Secondary | ICD-10-CM

## 2012-10-14 DIAGNOSIS — R059 Cough, unspecified: Secondary | ICD-10-CM

## 2012-10-14 DIAGNOSIS — R634 Abnormal weight loss: Secondary | ICD-10-CM

## 2012-10-14 DIAGNOSIS — J449 Chronic obstructive pulmonary disease, unspecified: Secondary | ICD-10-CM

## 2012-10-14 DIAGNOSIS — J984 Other disorders of lung: Secondary | ICD-10-CM

## 2012-10-14 NOTE — Telephone Encounter (Signed)
yes

## 2012-10-14 NOTE — Telephone Encounter (Signed)
The fax number is [phone] and the phone number to schedule the appt is [phone]

## 2012-10-14 NOTE — Telephone Encounter (Signed)
I will order the CT scan then if you will call that hospital and just find out where we fax the order and have them send [place] the results when completed

## 2012-10-15 NOTE — Telephone Encounter (Signed)
Patient appointment [facility] location of [facility] 10/15/12 12:30 arrive 15 min early radiology dept. [address] [facility] [address], [employer], patient aware, gave patient phone # [phone]. Faxed referral & patient insurance cards to [phone].

## 2012-10-17 ENCOUNTER — Telehealth: Payer: Self-pay

## 2012-10-17 NOTE — Telephone Encounter (Signed)
Patient informed per MD that CT's finding were C/W scarring and most likely recent infections. MD would like pt to do a follow up CT in 4-6 months. Pt voiced understanding. Copy of CT sent to be scanned

## 2012-10-24 ENCOUNTER — Other Ambulatory Visit: Payer: Self-pay

## 2012-10-24 DIAGNOSIS — G4700 Insomnia, unspecified: Secondary | ICD-10-CM

## 2012-10-24 DIAGNOSIS — F419 Anxiety disorder, unspecified: Secondary | ICD-10-CM

## 2012-10-24 MED ORDER — TIOTROPIUM BROMIDE MONOHYDRATE 18 MCG IN CAPS: 18.0000 ug | ORAL_CAPSULE | Freq: Every day | RESPIRATORY_TRACT | Status: DC

## 2012-10-24 NOTE — Telephone Encounter (Signed)
RX sent

## 2012-11-24 ENCOUNTER — Other Ambulatory Visit: Payer: Self-pay

## 2012-11-24 MED ORDER — RANITIDINE HCL 300 MG PO TABS: 300.0000 mg | ORAL_TABLET | Freq: Every day | ORAL | Status: DC

## 2012-11-27 ENCOUNTER — Other Ambulatory Visit: Payer: Self-pay | Admitting: Family Medicine

## 2012-12-21 ENCOUNTER — Other Ambulatory Visit: Payer: Self-pay | Admitting: Family Medicine

## 2012-12-24 ENCOUNTER — Other Ambulatory Visit: Payer: Self-pay | Admitting: Family Medicine

## 2013-01-24 ENCOUNTER — Other Ambulatory Visit: Payer: Self-pay | Admitting: Family Medicine

## 2013-01-26 NOTE — Telephone Encounter (Signed)
Please advise Xanax refill? Last RX was wrote on 09-26-12 quantity 60 with 2 refills.  If ok fax to [phone]

## 2013-02-03 ENCOUNTER — Other Ambulatory Visit: Payer: Self-pay

## 2013-02-03 ENCOUNTER — Telehealth: Payer: Self-pay

## 2013-02-03 MED ORDER — BACLOFEN 10 MG PO TABS: 10.0000 mg | ORAL_TABLET | Freq: Two times a day (BID) | ORAL | Status: AC

## 2013-02-03 NOTE — Telephone Encounter (Signed)
Patient informed that insurance will not pay for flexeril and we are sending in baclofen

## 2013-03-27 ENCOUNTER — Ambulatory Visit: Payer: Medicare Other | Admitting: Family Medicine

## 2013-04-03 ENCOUNTER — Ambulatory Visit: Payer: Medicare Other | Admitting: Family Medicine

## 2013-05-25 ENCOUNTER — Telehealth: Payer: Self-pay | Admitting: Family Medicine

## 2013-05-25 MED ORDER — LISINOPRIL 2.5 MG PO TABS: 2.5000 mg | ORAL_TABLET | Freq: Every day | ORAL | Status: AC

## 2013-05-25 NOTE — Telephone Encounter (Signed)
LISINOPRIL 10 MFG TABLET QTY 30 TAKE 1 TABLET BY MOPUTH EVERY DAY LAST 4 2 14

## 2013-08-04 IMAGING — CR DG CHEST 2V
2 series · 2 of 2 positions shown · non-contrast
Comparison: Chest radiograph 03/12/2006

CLINICAL DATA: Cough and weight loss in a smoker

CHEST - 2 VIEW

[view not recorded (1 of 2)]
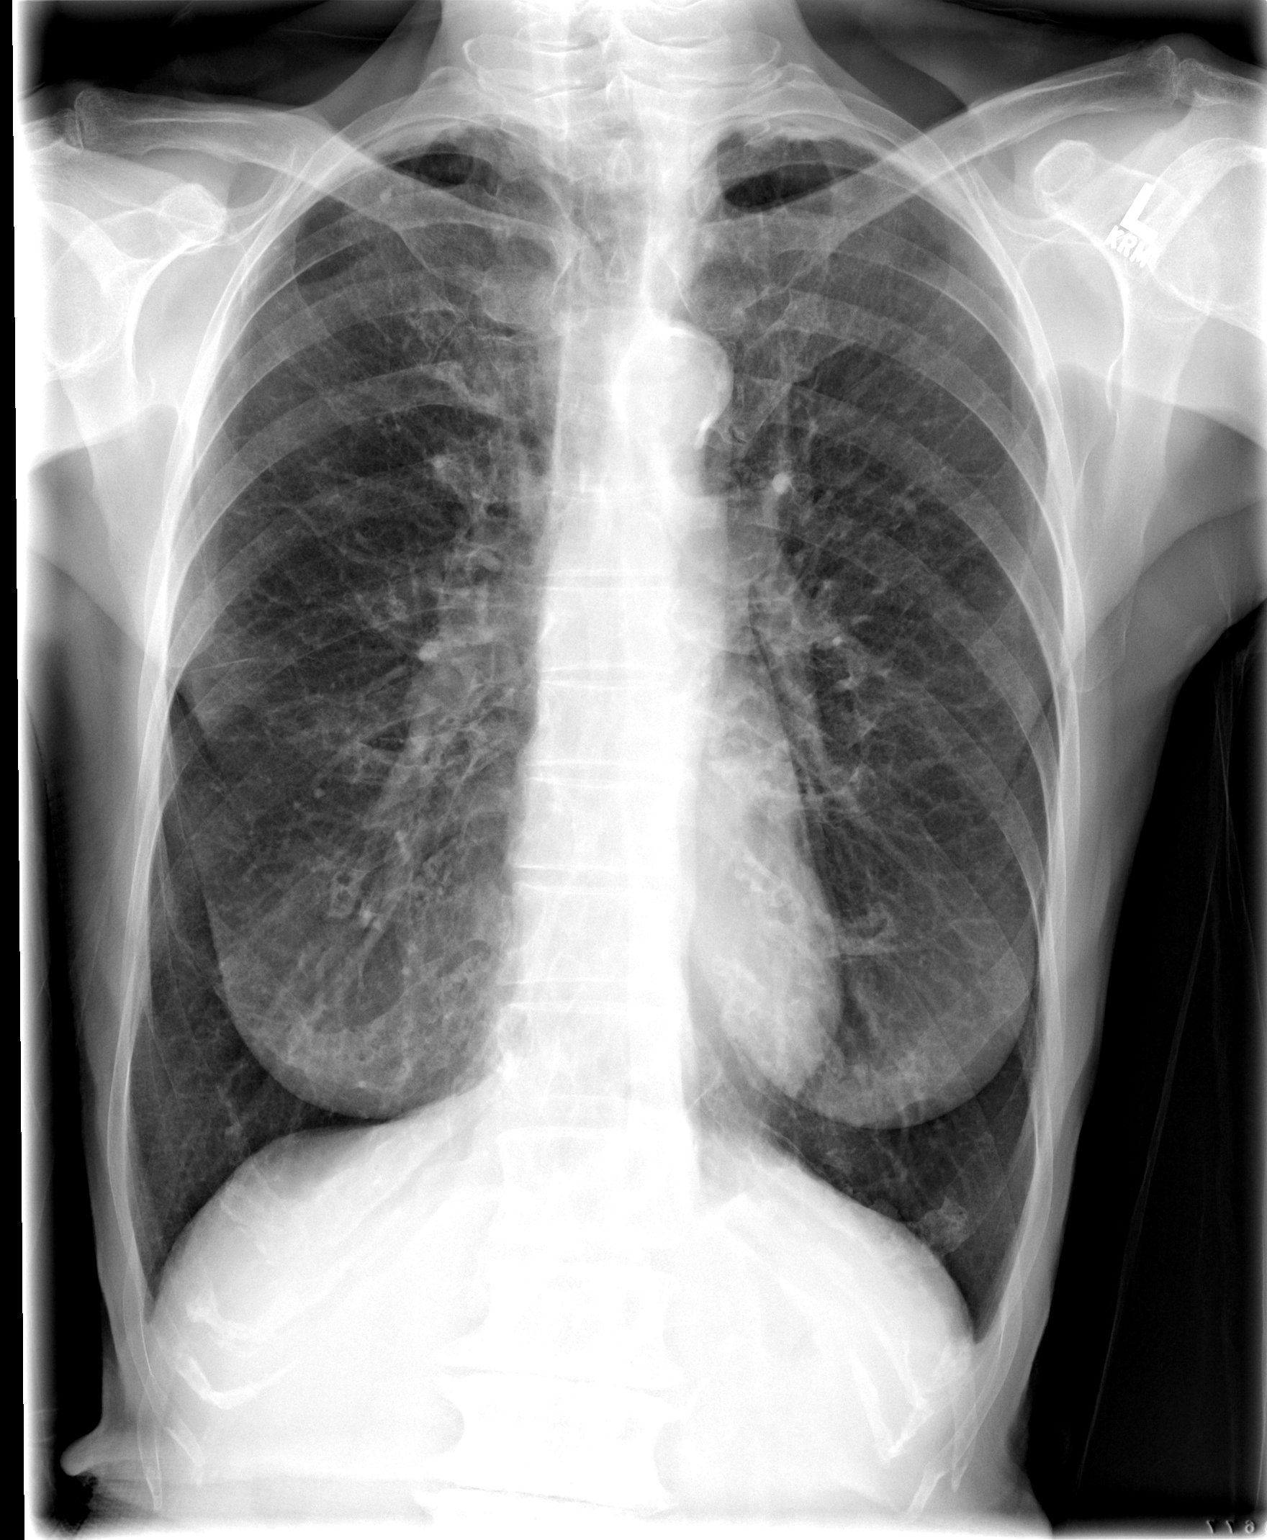

[view not recorded (2 of 2)]
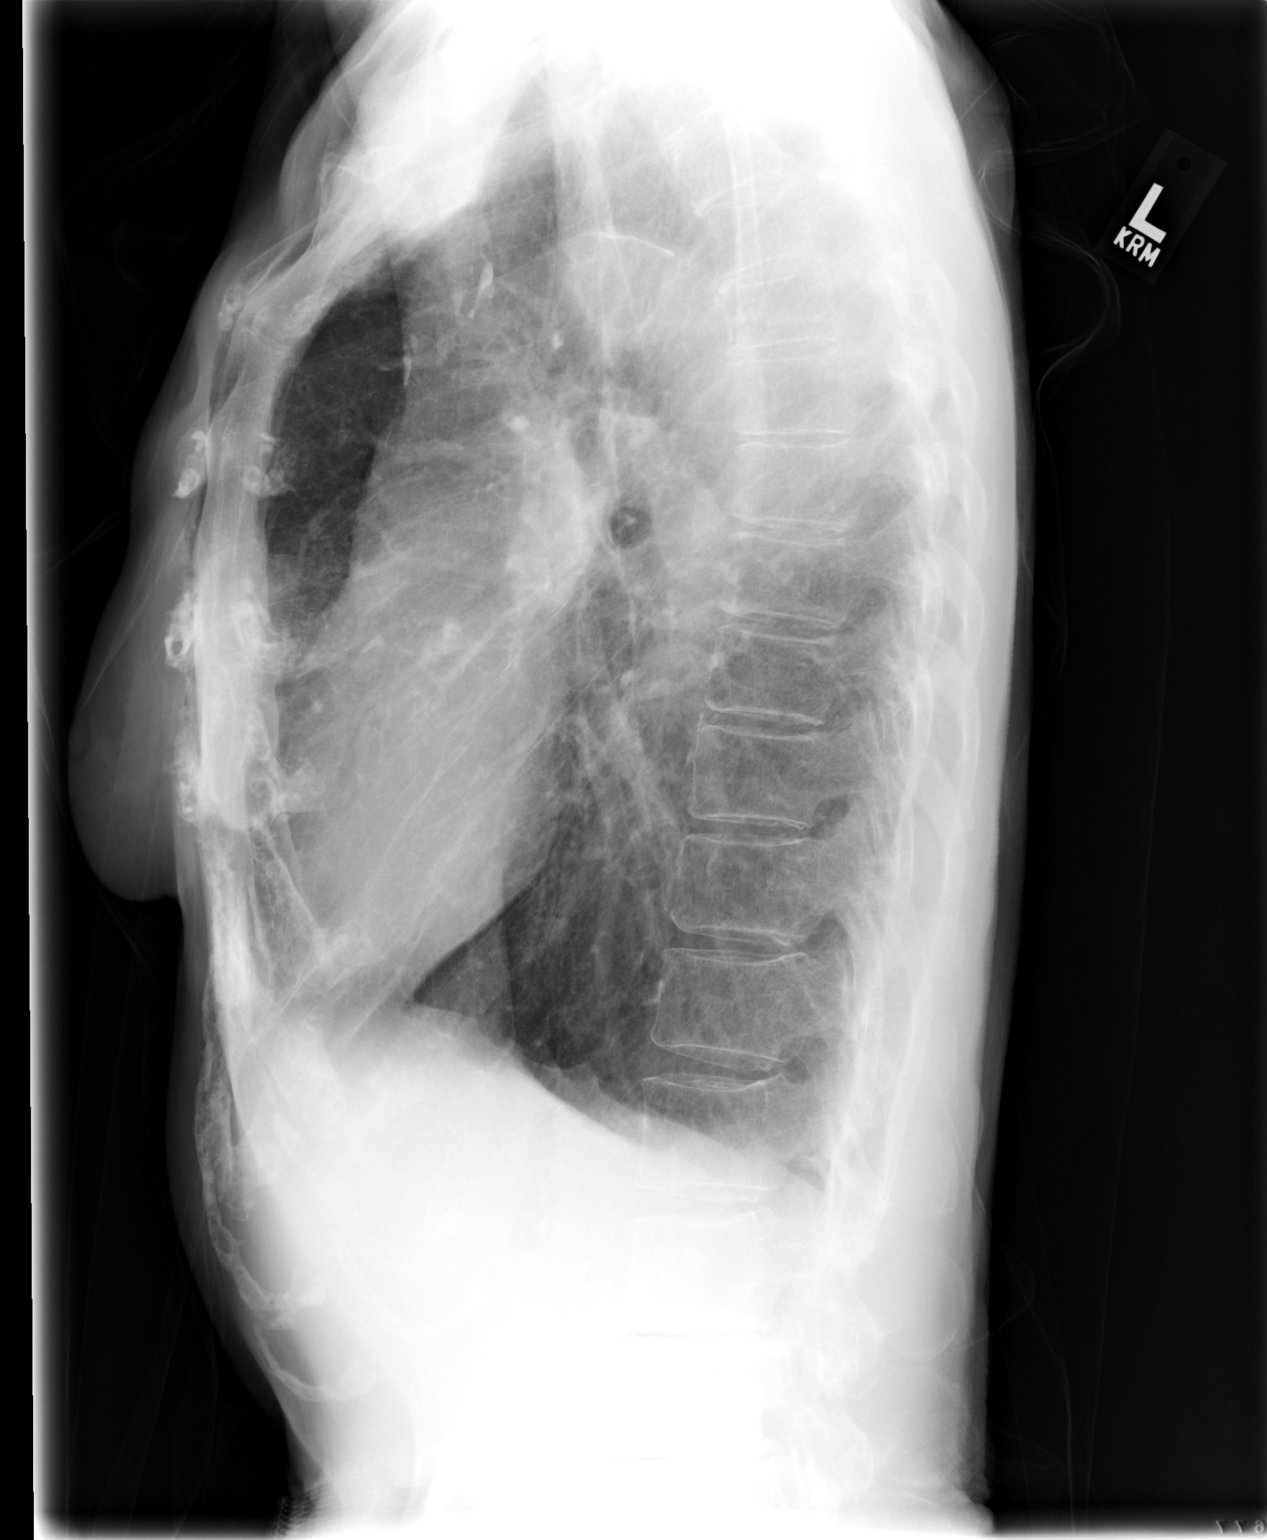

[2 of 2 positions shown; findings below may reference images not displayed]

FINDINGS: Heart size is normal.  There is atherosclerotic
calcification of the transverse aortic arch.  The lungs are
markedly hyperinflated. There is stable slight pleuroparenchymal
thickening at both lung apices. 5 mm probable calcified granuloma
the right apex.  More peripherally in the right upper lung, there
are three possible faint peripheral subcentimeter nodules or
nodular airspace opacities.

No large area of airspace consolidation or pleural effusion.  No
evidence of lymphadenopathy. No acute bony abnormality.  There are
degenerative changes of the visualized upper lumbar spine.
IMPRESSION: 1.  Suggestion of 3 faint small nodular opacities in the peripheral
aspect of the right lung. These appear new compared to prior exam.
This could reflect an infectious process, true pulmonary nodules,
or could be overlap of normal structures.  Chest CT could be
performed for further evaluation, and can be ordered without
intravenous contrast for the purpose of evaluating for pulmonary
nodules or airspace disease.
2.  COPD/emphysema.

These results will be called to the ordering clinician or
representative by the Radiologist Assistant, and communication
documented in the PACS Dashboard.

## 2013-10-06 ENCOUNTER — Other Ambulatory Visit: Payer: Self-pay | Admitting: Family Medicine

## 2014-01-04 ENCOUNTER — Telehealth: Payer: Self-pay | Admitting: Family Medicine

## 2014-01-04 NOTE — Telephone Encounter (Signed)
refill-ranitidine 300mg  tablet. Take one tablet at bedtime *prescriber will only fill 30 day supply patient needs to make appointment* qty 30 last fill 10.16.14

## 2014-01-05 MED ORDER — RANITIDINE HCL 300 MG PO TABS: 300.0000 mg | ORAL_TABLET | Freq: Every day | ORAL | Status: AC

## 2014-01-05 NOTE — Telephone Encounter (Signed)
I will send in a 15 day supply. Pt was to return around 11/21/13. Pt had canceled an appt and has a no show.   Please schedule a follow up for pt. And let her know that there will be no more refills until seen  Thanks

## 2014-02-11 ENCOUNTER — Telehealth: Payer: Self-pay | Admitting: Family Medicine

## 2014-02-11 NOTE — Telephone Encounter (Signed)
Refill ranitidine

## 2014-02-11 NOTE — Telephone Encounter (Signed)
Denied refill pt need office visit. See last msg...[name]

## 2018-07-24 DEATH — deceased
